# Patient Record
Sex: Female | Born: 1980 | Race: White | Hispanic: No | Marital: Married | Smoking: Current every day smoker
Health system: Southern US, Community
[De-identification: ages and names within clinical notes are randomized; demographics above are authoritative.]

## PROBLEM LIST (undated history)

## (undated) DIAGNOSIS — M069 Rheumatoid arthritis, unspecified: Secondary | ICD-10-CM

## (undated) DIAGNOSIS — K219 Gastro-esophageal reflux disease without esophagitis: Secondary | ICD-10-CM

## (undated) DIAGNOSIS — F329 Major depressive disorder, single episode, unspecified: Secondary | ICD-10-CM

## (undated) DIAGNOSIS — R87610 Atypical squamous cells of undetermined significance on cytologic smear of cervix (ASC-US): Secondary | ICD-10-CM

## (undated) DIAGNOSIS — F419 Anxiety disorder, unspecified: Secondary | ICD-10-CM

## (undated) DIAGNOSIS — F32A Depression, unspecified: Secondary | ICD-10-CM

## (undated) HISTORY — DX: Gastro-esophageal reflux disease without esophagitis: K21.9

## (undated) HISTORY — DX: Atypical squamous cells of undetermined significance on cytologic smear of cervix (ASC-US): R87.610

## (undated) HISTORY — PX: COLPOSCOPY: SHX161

## (undated) HISTORY — PX: SALPINGOOPHORECTOMY: SHX82

## (undated) HISTORY — DX: Anxiety disorder, unspecified: F41.9

---

## 2005-11-09 ENCOUNTER — Observation Stay: Payer: Self-pay | Admitting: Unknown Physician Specialty

## 2005-11-15 ENCOUNTER — Inpatient Hospital Stay: Payer: Self-pay

## 2008-06-11 ENCOUNTER — Emergency Department: Payer: Self-pay | Admitting: Emergency Medicine

## 2008-10-31 ENCOUNTER — Emergency Department: Payer: Self-pay | Admitting: Emergency Medicine

## 2010-06-27 ENCOUNTER — Ambulatory Visit: Payer: Self-pay | Admitting: Family Medicine

## 2012-05-11 HISTORY — PX: FINGER SURGERY: SHX640

## 2012-12-08 ENCOUNTER — Ambulatory Visit: Payer: Self-pay | Admitting: Family Medicine

## 2012-12-11 ENCOUNTER — Emergency Department: Payer: Self-pay | Admitting: Emergency Medicine

## 2012-12-11 LAB — CBC WITH DIFFERENTIAL/PLATELET
Basophil %: 1.2 %
HCT: 36.8 % (ref 35.0–47.0)
HGB: 12.9 g/dL (ref 12.0–16.0)
Lymphocyte #: 2.1 10*3/uL (ref 1.0–3.6)
Lymphocyte %: 36 %
MCHC: 35.1 g/dL (ref 32.0–36.0)
Monocyte #: 0.6 x10 3/mm (ref 0.2–0.9)
Monocyte %: 9.8 %
Neutrophil #: 3 10*3/uL (ref 1.4–6.5)
Neutrophil %: 51.3 %
RDW: 12.5 % (ref 11.5–14.5)
WBC: 5.9 10*3/uL (ref 3.6–11.0)

## 2012-12-11 LAB — URINALYSIS, COMPLETE
Bacteria: NONE SEEN
Bilirubin,UR: NEGATIVE
Glucose,UR: NEGATIVE mg/dL (ref 0–75)
Leukocyte Esterase: NEGATIVE
RBC,UR: 1 /HPF (ref 0–5)
Squamous Epithelial: NONE SEEN
WBC UR: NONE SEEN /HPF (ref 0–5)

## 2012-12-11 LAB — COMPREHENSIVE METABOLIC PANEL
Albumin: 4.1 g/dL (ref 3.4–5.0)
Anion Gap: 4 — ABNORMAL LOW (ref 7–16)
Bilirubin,Total: 0.5 mg/dL (ref 0.2–1.0)
Calcium, Total: 9 mg/dL (ref 8.5–10.1)
Co2: 28 mmol/L (ref 21–32)
EGFR (African American): >60
EGFR (Non-African Amer.): >60
Glucose: 75 mg/dL (ref 65–99)
SGOT(AST): 28 U/L (ref 15–37)
Sodium: 140 mmol/L (ref 136–145)

## 2012-12-11 LAB — PREGNANCY, URINE: Pregnancy Test, Urine: NEGATIVE m[IU]/mL

## 2012-12-11 LAB — CK: CK, Total: 117 U/L (ref 21–215)

## 2012-12-11 LAB — GC/CHLAMYDIA PROBE AMP

## 2013-04-17 ENCOUNTER — Emergency Department: Payer: Self-pay | Admitting: Emergency Medicine

## 2013-04-18 ENCOUNTER — Emergency Department: Payer: Self-pay | Admitting: Emergency Medicine

## 2013-10-22 ENCOUNTER — Emergency Department: Payer: Self-pay | Admitting: Internal Medicine

## 2013-10-25 ENCOUNTER — Ambulatory Visit: Payer: Self-pay | Admitting: Orthopedic Surgery

## 2013-10-25 LAB — CBC
HCT: 40.4 % (ref 35.0–47.0)
HGB: 13.6 g/dL (ref 12.0–16.0)
MCH: 34.5 pg — AB (ref 26.0–34.0)
MCHC: 33.7 g/dL (ref 32.0–36.0)
MCV: 102 fL — AB (ref 80–100)
Platelet: 361 10*3/uL (ref 150–440)
RBC: 3.95 10*6/uL (ref 3.80–5.20)
RDW: 12.7 % (ref 11.5–14.5)
WBC: 5.1 10*3/uL (ref 3.6–11.0)

## 2013-10-25 LAB — URINALYSIS, COMPLETE
Bilirubin,UR: NEGATIVE
Glucose,UR: NEGATIVE mg/dL (ref 0–75)
Ketone: NEGATIVE
Leukocyte Esterase: NEGATIVE
Nitrite: NEGATIVE
PH: 6 (ref 4.5–8.0)
Protein: NEGATIVE
Specific Gravity: 1.017 (ref 1.003–1.030)
WBC UR: 4 /HPF (ref 0–5)

## 2013-10-25 LAB — BASIC METABOLIC PANEL
ANION GAP: 5 — AB (ref 7–16)
BUN: 9 mg/dL (ref 7–18)
CALCIUM: 8.7 mg/dL (ref 8.5–10.1)
CO2: 27 mmol/L (ref 21–32)
CREATININE: 0.72 mg/dL (ref 0.60–1.30)
Chloride: 110 mmol/L — ABNORMAL HIGH (ref 98–107)
EGFR (African American): >60
GLUCOSE: 76 mg/dL (ref 65–99)
Osmolality: 281 (ref 275–301)
POTASSIUM: 4.2 mmol/L (ref 3.5–5.1)
SODIUM: 142 mmol/L (ref 136–145)

## 2013-10-25 LAB — APTT: ACTIVATED PTT: 23.9 s (ref 23.6–35.9)

## 2013-10-25 LAB — PROTIME-INR
INR: 1
Prothrombin Time: 13 secs (ref 11.5–14.7)

## 2014-07-20 ENCOUNTER — Emergency Department: Payer: Self-pay | Admitting: Emergency Medicine
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
## 2015-03-06 ENCOUNTER — Ambulatory Visit
Admission: EM | Admit: 2015-03-06 | Discharge: 2015-03-06 | Disposition: A | Discharge status: Home / Self Care | Payer: BLUE CROSS/BLUE SHIELD | Attending: Family Medicine | Admitting: Family Medicine

## 2015-03-06 ENCOUNTER — Ambulatory Visit: Admission: EM | Admit: 2015-03-06 | Discharge: 2015-03-06 | Discharge status: Absent without leave | Payer: Self-pay

## 2015-03-06 DIAGNOSIS — R1084 Generalized abdominal pain: Secondary | ICD-10-CM

## 2015-03-06 DIAGNOSIS — N760 Acute vaginitis: Secondary | ICD-10-CM | POA: Diagnosis not present

## 2015-03-06 DIAGNOSIS — B9689 Other specified bacterial agents as the cause of diseases classified elsewhere: Secondary | ICD-10-CM

## 2015-03-06 DIAGNOSIS — A499 Bacterial infection, unspecified: Secondary | ICD-10-CM

## 2015-03-06 DIAGNOSIS — R102 Pelvic and perineal pain: Secondary | ICD-10-CM

## 2015-03-06 LAB — URINALYSIS COMPLETE WITH MICROSCOPIC (ARMC ONLY)
BILIRUBIN URINE: NEGATIVE
Glucose, UA: NEGATIVE mg/dL
KETONES UR: NEGATIVE mg/dL
LEUKOCYTES UA: NEGATIVE
Nitrite: NEGATIVE
PH: 6.5 (ref 5.0–8.0)
Protein, ur: NEGATIVE mg/dL
RBC / HPF: NONE SEEN RBC/hpf (ref <3)
SPECIFIC GRAVITY, URINE: 1.015 (ref 1.005–1.030)

## 2015-03-06 LAB — CBC WITH DIFFERENTIAL/PLATELET
Basophils Absolute: 0.1 10*3/uL (ref 0–0.1)
Basophils Relative: 1 %
EOS PCT: 2 %
Eosinophils Absolute: 0.1 10*3/uL (ref 0–0.7)
HEMATOCRIT: 38 % (ref 35.0–47.0)
HEMOGLOBIN: 12.9 g/dL (ref 12.0–16.0)
LYMPHS ABS: 1.8 10*3/uL (ref 1.0–3.6)
LYMPHS PCT: 27 %
MCH: 33.1 pg (ref 26.0–34.0)
MCHC: 34 g/dL (ref 32.0–36.0)
MCV: 97.3 fL (ref 80.0–100.0)
Monocytes Absolute: 0.6 10*3/uL (ref 0.2–0.9)
Monocytes Relative: 10 %
NEUTROS ABS: 4.1 10*3/uL (ref 1.4–6.5)
NEUTROS PCT: 60 %
Platelets: 349 10*3/uL (ref 150–440)
RBC: 3.91 MIL/uL (ref 3.80–5.20)
RDW: 12.5 % (ref 11.5–14.5)
WBC: 6.7 10*3/uL (ref 3.6–11.0)

## 2015-03-06 LAB — COMPREHENSIVE METABOLIC PANEL
ALK PHOS: 54 U/L (ref 38–126)
ALT: 13 U/L — AB (ref 14–54)
AST: 18 U/L (ref 15–41)
Albumin: 4.6 g/dL (ref 3.5–5.0)
Anion gap: 9 (ref 5–15)
BILIRUBIN TOTAL: 0.7 mg/dL (ref 0.3–1.2)
BUN: 9 mg/dL (ref 6–20)
CALCIUM: 9.2 mg/dL (ref 8.9–10.3)
CO2: 27 mmol/L (ref 22–32)
CREATININE: 0.73 mg/dL (ref 0.44–1.00)
Chloride: 102 mmol/L (ref 101–111)
Glucose, Bld: 96 mg/dL (ref 65–99)
Potassium: 3.6 mmol/L (ref 3.5–5.1)
Sodium: 138 mmol/L (ref 135–145)
Total Protein: 7.4 g/dL (ref 6.5–8.1)

## 2015-03-06 LAB — WET PREP, GENITAL
TRICH WET PREP: NONE SEEN
YEAST WET PREP: NONE SEEN

## 2015-03-06 LAB — CHLAMYDIA/NGC RT PCR (ARMC ONLY)
Chlamydia Tr: NOT DETECTED
N gonorrhoeae: NOT DETECTED

## 2015-03-06 LAB — PREGNANCY, URINE: PREG TEST UR: NEGATIVE

## 2015-03-06 MED ORDER — METRONIDAZOLE 0.75 % VA GEL: 1.0000 | Freq: Every day | VAGINAL | Status: DC

## 2015-03-06 NOTE — ED Notes (Signed)
Pt verbalizes understanding to go to [facility] tomorrow at 2 for Ultrasound.

## 2015-03-06 NOTE — ED Provider Notes (Signed)
CSN: [id number]     Arrival date & time 03/06/15  1258 History   First MD Initiated Contact with Patient 03/06/15 1331     Chief Complaint  Patient presents with  . Abdominal Pain   (Consider location/radiation/quality/duration/timing/severity/associated sxs/prior Treatment) HPI Comments: 34 yo female with a h/o chronic intermittent abdominal and pelvic pains for the last 3-4 years, every 3-4 months. States pain usually last 3-4 days, but this time pain has lasted 2 weeks and has been associated with intermittent chills, nausea, bloating, "loose stools",  urinary discomfort, and vaginal discharge. Denies any vomiting, watery diarrhea, melena, hematochezia. States has been on Depo provera for contraception for about 4 years.  Also complains of upset stomach after eating and intermittent sensation of "food getting stuck in the chest" after swallowing. Denies any trouble breathing.   Patient is a 34 y.o. female presenting with abdominal pain. The history is provided by the patient.  Abdominal Pain   History reviewed. No pertinent past medical history. History reviewed. No pertinent past surgical history. No family history on file. Social History  Substance Use Topics  . Smoking status: Never Smoker   . Smokeless tobacco: None  . Alcohol Use: Yes   OB History    No data available     Review of Systems  Gastrointestinal: Positive for abdominal pain.    Allergies  Review of patient's allergies indicates no known allergies.  Home Medications   Prior to Admission medications   Medication Sig Start Date End Date Taking? Authorizing Provider  citalopram (CELEXA) 20 MG tablet Take 20 mg by mouth daily.   Yes Historical Provider, MD  lactobacillus acidophilus (BACID) TABS tablet Take 2 tablets by mouth 3 (three) times daily.   Yes Historical Provider, MD  Multiple Vitamin (MULTIVITAMIN) capsule Take 1 capsule by mouth daily.   Yes Historical Provider, MD  metroNIDAZOLE (METROGEL  VAGINAL) 0.75 % vaginal gel Place 1 Applicatorful vaginally at bedtime. For 5 days 03/06/15   Norval Gable, MD   Meds Ordered and Administered this Visit  Medications - No data to display  BP 120/84 mmHg  Pulse 86  Temp(Src) 97.7 F (36.5 C) (Tympanic)  Ht 5\' 4"  (1.626 m)  Wt 135 lb (61.236 kg)  BMI 23.16 kg/m2  SpO2 100% No data found.   Physical Exam  Constitutional: She appears well-developed and well-nourished. No distress.  Abdominal: Soft. Bowel sounds are normal. She exhibits distension (slightly distended). She exhibits no mass. There is tenderness (mild to moderate diffuse tenderness to palpation; worse over the left lower abdomen; no rebound or guarding). There is no rebound and no guarding. Hernia confirmed negative in the right inguinal area and confirmed negative in the left inguinal area.  Genitourinary: Pelvic exam was performed with patient supine. There is no rash, tenderness or lesion on the right labia. There is no rash, tenderness or lesion on the left labia. Uterus is tender. Cervix exhibits friability. Cervix exhibits no discharge. Right adnexum displays tenderness. Right adnexum displays no mass and no fullness. Left adnexum displays tenderness. Left adnexum displays no mass and no fullness. No erythema, tenderness or bleeding in the vagina. No foreign body around the vagina. Vaginal discharge: mild white vaginal discharge.  Lymphadenopathy:       Right: No inguinal adenopathy present.       Left: No inguinal adenopathy present.  Skin: She is not diaphoretic. No erythema.  Nursing note and vitals reviewed.   ED Course  Procedures (including critical care time)  Labs  Review Labs Reviewed  WET PREP, GENITAL - Abnormal; Notable for the following:    Clue Cells Wet Prep HPF POC MODERATE (*)    WBC, Wet Prep HPF POC FEW (*)    All other components within normal limits  URINALYSIS COMPLETEWITH MICROSCOPIC ([facility] ONLY) - Abnormal; Notable for the following:     Hgb urine dipstick TRACE (*)    Squamous Epithelial / LPF 0-5 (*)    All other components within normal limits  COMPREHENSIVE METABOLIC PANEL - Abnormal; Notable for the following:    ALT 13 (*)    All other components within normal limits  CHLAMYDIA/NGC RT PCR ([facility] ONLY)  URINE CULTURE  CBC WITH DIFFERENTIAL/PLATELET  PREGNANCY, URINE    Imaging Review No results found.   Visual Acuity Review  Right Eye Distance:   Left Eye Distance:   Bilateral Distance:    Right Eye Near:   Left Eye Near:    Bilateral Near:         MDM   1. Generalized abdominal pain   2. Pelvic pain in female   3. Bacterial vaginosis     New Prescriptions   METRONIDAZOLE (METROGEL VAGINAL) 0.75 % VAGINAL GEL    Place 1 Applicatorful vaginally at bedtime. For 5 days   1. Lab results and diagnosis reviewed with patient 2. rx as per orders above; reviewed possible side effects, interactions, risks and benefits  3. Recommend abdominal and pelvic ultrasound for further evaluation; patient will be notified of results and further management 4. Follow-up prn if symptoms worsen or don't improve  Norval Gable, MD 03/06/15 [phone]

## 2015-03-06 NOTE — ED Notes (Signed)
Pt states "I have been taking Depo for about 4 years. I developed stomach pain a couple weeks ago and has not let up. I feel bloated after eating, cramping."

## 2015-03-07 ENCOUNTER — Ambulatory Visit: Payer: BLUE CROSS/BLUE SHIELD

## 2015-03-07 ENCOUNTER — Telehealth: Payer: Self-pay

## 2015-03-07 ENCOUNTER — Ambulatory Visit
Admission: RE | Admit: 2015-03-07 | Discharge: 2015-03-07 | Disposition: A | Discharge status: Home / Self Care | Payer: BLUE CROSS/BLUE SHIELD | Source: Ambulatory Visit | Attending: Family Medicine | Admitting: Family Medicine

## 2015-03-07 ENCOUNTER — Other Ambulatory Visit: Payer: Self-pay | Admitting: Family Medicine

## 2015-03-07 DIAGNOSIS — R140 Abdominal distension (gaseous): Secondary | ICD-10-CM

## 2015-03-07 DIAGNOSIS — R1084 Generalized abdominal pain: Secondary | ICD-10-CM

## 2015-03-07 NOTE — ED Notes (Signed)
Call from [facility] [place] [facility]. Patient's US's were (-). Small amount of free fluid in the Pelvis but otherwise normal. Pt had left as she had children to pick up. She will await a call from Dr. Zenda Alpers regarding the test.

## 2015-03-08 ENCOUNTER — Telehealth: Payer: Self-pay | Admitting: Emergency Medicine

## 2015-03-08 LAB — URINE CULTURE
CULTURE: NO GROWTH
SPECIAL REQUESTS: NORMAL

## 2015-03-08 NOTE — ED Notes (Signed)
Patient was notified that her culture results were negative and that her ultrasound was negative except from some fluid in the pelvis that should resolve on its own.  Patient was instructed to follow-up with her PCP or gynecologist if her symptoms persist or worsen.  Patient verbalized understanding.

## 2015-03-22 DIAGNOSIS — R41840 Attention and concentration deficit: Secondary | ICD-10-CM | POA: Insufficient documentation

## 2015-03-22 DIAGNOSIS — R002 Palpitations: Secondary | ICD-10-CM | POA: Insufficient documentation

## 2015-07-19 LAB — HM PAP SMEAR

## 2015-08-11 ENCOUNTER — Encounter: Payer: Self-pay | Admitting: Emergency Medicine

## 2015-08-11 ENCOUNTER — Ambulatory Visit
Admission: EM | Admit: 2015-08-11 | Discharge: 2015-08-11 | Disposition: A | Discharge status: Home / Self Care | Payer: Managed Care, Other (non HMO) | Attending: Internal Medicine | Admitting: Internal Medicine

## 2015-08-11 ENCOUNTER — Ambulatory Visit (INDEPENDENT_AMBULATORY_CARE_PROVIDER_SITE_OTHER): Payer: Managed Care, Other (non HMO)

## 2015-08-11 DIAGNOSIS — M722 Plantar fascial fibromatosis: Secondary | ICD-10-CM

## 2015-08-11 DIAGNOSIS — S93699A Other sprain of unspecified foot, initial encounter: Secondary | ICD-10-CM

## 2015-08-11 MED ORDER — ETODOLAC 500 MG PO TABS: 500.0000 mg | ORAL_TABLET | Freq: Two times a day (BID) | ORAL | Status: DC

## 2015-08-11 NOTE — Discharge Instructions (Signed)
Try wearing brace when up and around, to see if better pain relief occurs.   Prescription for [facility]od[facility]ac sent [facility] [place]. Prescription for walking boot orthosis given; can fill at [place], [place], or [place]. Followup as planned with podiatry in April.

## 2015-08-11 NOTE — ED Provider Notes (Signed)
CSN: [id number]     Arrival date & time 08/11/15  1320 History   First MD Initiated Contact with Patient 08/11/15 1409     Chief Complaint  Patient presents with  . Foot Pain   HPI  35 yo lady with hx of walking on beach 3 wks ago, in tennis shoes (usually walks on beach barefoot or in flip-flops).  Long walk, 1-1.5 miles.  Subsequent onset of pain/swelling in medial arch of R foot, not improving.  Has been wearing a foot brace qhs, not helping.  Works at [employer] as a Scientist, water quality, on Retail banker all day.  Denies bruising, redness, rash on foot.  No fever, no other joint pain.  No malaise.    History reviewed. No pertinent past medical history. History reviewed. R pinky finger surgery History reviewed. Mother DM, father hypoglycemia Social History  Substance Use Topics  . Smoking status: Previous Smoker   . Smokeless tobacco: None  . Alcohol Use: Yes    Review of Systems  All other systems reviewed and are negative.   Allergies  Review of patient's allergies indicates no known allergies.  Home Medications   Prior to Admission medications   Medication Sig Start Date End Date Taking? Authorizing Provider  amitriptyline (ELAVIL) 10 MG tablet Take 10 mg by mouth at bedtime.   Yes Historical Provider, MD  citalopram (CELEXA) 20 MG tablet Take 20 mg by mouth daily.    Historical Provider, MD  etodolac (LODINE) 500 MG tablet Take 1 tablet (500 mg total) by mouth 2 (two) times daily. 08/11/15   Sherlene Shams, MD  lactobacillus acidophilus (BACID) TABS tablet Take 2 tablets by mouth 3 (three) times daily.    Historical Provider, MD  Multiple Vitamin (MULTIVITAMIN) capsule Take 1 capsule by mouth daily.    Historical Provider, MD      BP 130/85 mmHg  Pulse 80  Temp(Src) 99 F (37.2 C) (Tympanic)  Resp 16  Ht 5\' 4"  (1.626 m)  Wt 129 lb (58.514 kg)  BMI 22.13 kg/m2  SpO2 100%  LMP 07/31/2015 (Approximate)   Physical Exam  Constitutional: She is oriented to person, place, and time. No  distress.  Alert, nicely groomed  HENT:  Head: Atraumatic.  Eyes:  Conjugate gaze, no eye redness/drainage  Neck: Neck supple.  Cardiovascular: Normal rate.   Pulmonary/Chest: No respiratory distress.  Abdominal: She exhibits no distension.  Musculoskeletal: Normal range of motion.  No leg swelling Mildly swollen R medial instep compared to L.  Not bruised/red, no rash.  Foot is warm.  Diffusely tender to palpation over swollen area.  Neurological: She is alert and oriented to person, place, and time.  Skin: Skin is warm and dry.  No cyanosis  Nursing note and vitals reviewed.   ED Course  Procedures (including critical care time)  Imaging Review Dg Foot Complete Right  08/11/2015  CLINICAL DATA:  RIGHT plantar posterior and medial foot pain for 3 weeks after walking a long distance at beach in [employer] [place] EXAM: RIGHT FOOT COMPLETE - 3+ VIEW COMPARISON:  None FINDINGS: Osseous mineralization normal. Joint spaces preserved. No fracture, dislocation, or bone destruction. IMPRESSION: Normal exam. Electronically Signed   By: Lavonia Dana M.D.   On: 08/11/2015 14:36       MDM   1. Traumatic plantar fasciitis    Meds ordered this encounter  Medications  . etodolac (LODINE) 500 MG tablet    Sig: Take 1 tablet (500 mg total) by mouth 2 (two)  times daily.    Dispense:  30 tablet    Refill:  1   rx given for walking boot orthosis, to wear while up and around.  Post op shoe might also give some relief. Followup as planned with podiatrist mid April.      Sherlene Shams, MD 08/12/15 [phone]

## 2015-08-11 NOTE — ED Notes (Signed)
Patient c/o pain on the bottom of her right foot for 3 weeks.

## 2015-09-25 DIAGNOSIS — M1380 Other specified arthritis, unspecified site: Secondary | ICD-10-CM | POA: Insufficient documentation

## 2015-10-04 ENCOUNTER — Other Ambulatory Visit: Payer: Self-pay | Admitting: Student

## 2015-10-04 DIAGNOSIS — K529 Noninfective gastroenteritis and colitis, unspecified: Secondary | ICD-10-CM

## 2015-10-04 DIAGNOSIS — R1084 Generalized abdominal pain: Secondary | ICD-10-CM

## 2015-10-17 ENCOUNTER — Ambulatory Visit
Admission: RE | Admit: 2015-10-17 | Discharge: 2015-10-17 | Disposition: A | Discharge status: Home / Self Care | Payer: Managed Care, Other (non HMO) | Source: Ambulatory Visit | Attending: Student | Admitting: Student

## 2015-10-17 DIAGNOSIS — R1084 Generalized abdominal pain: Secondary | ICD-10-CM | POA: Insufficient documentation

## 2015-10-17 DIAGNOSIS — K529 Noninfective gastroenteritis and colitis, unspecified: Secondary | ICD-10-CM | POA: Diagnosis present

## 2015-10-17 MED ORDER — TECHNETIUM TC 99M MEBROFENIN IV KIT
5.0000 | PACK | Freq: Once | INTRAVENOUS | Status: AC | PRN
  Administered 2015-10-17: 5.09 via INTRAVENOUS

## 2015-12-24 DIAGNOSIS — Z9181 History of falling: Secondary | ICD-10-CM | POA: Insufficient documentation

## 2015-12-24 DIAGNOSIS — M25521 Pain in right elbow: Secondary | ICD-10-CM | POA: Insufficient documentation

## 2015-12-24 DIAGNOSIS — M542 Cervicalgia: Secondary | ICD-10-CM

## 2015-12-24 DIAGNOSIS — Z1589 Genetic susceptibility to other disease: Secondary | ICD-10-CM | POA: Insufficient documentation

## 2015-12-24 DIAGNOSIS — R200 Anesthesia of skin: Secondary | ICD-10-CM | POA: Insufficient documentation

## 2015-12-24 DIAGNOSIS — G43909 Migraine, unspecified, not intractable, without status migrainosus: Secondary | ICD-10-CM | POA: Insufficient documentation

## 2015-12-24 DIAGNOSIS — Z79899 Other long term (current) drug therapy: Secondary | ICD-10-CM | POA: Insufficient documentation

## 2015-12-24 DIAGNOSIS — G8929 Other chronic pain: Secondary | ICD-10-CM | POA: Insufficient documentation

## 2015-12-24 DIAGNOSIS — M5442 Lumbago with sciatica, left side: Secondary | ICD-10-CM

## 2015-12-24 DIAGNOSIS — M5441 Lumbago with sciatica, right side: Secondary | ICD-10-CM

## 2015-12-24 DIAGNOSIS — M25522 Pain in left elbow: Secondary | ICD-10-CM

## 2015-12-24 DIAGNOSIS — M2560 Stiffness of unspecified joint, not elsewhere classified: Secondary | ICD-10-CM | POA: Insufficient documentation

## 2015-12-24 DIAGNOSIS — R202 Paresthesia of skin: Secondary | ICD-10-CM

## 2016-05-25 DIAGNOSIS — Z791 Long term (current) use of non-steroidal anti-inflammatories (NSAID): Secondary | ICD-10-CM | POA: Insufficient documentation

## 2016-05-25 DIAGNOSIS — N938 Other specified abnormal uterine and vaginal bleeding: Secondary | ICD-10-CM | POA: Insufficient documentation

## 2016-06-12 DIAGNOSIS — E782 Mixed hyperlipidemia: Secondary | ICD-10-CM | POA: Insufficient documentation

## 2016-10-08 ENCOUNTER — Ambulatory Visit
Admission: EM | Admit: 2016-10-08 | Discharge: 2016-10-08 | Disposition: A | Discharge status: Home / Self Care | Payer: Managed Care, Other (non HMO) | Attending: Family Medicine | Admitting: Family Medicine

## 2016-10-08 DIAGNOSIS — R05 Cough: Secondary | ICD-10-CM

## 2016-10-08 DIAGNOSIS — B9789 Other viral agents as the cause of diseases classified elsewhere: Secondary | ICD-10-CM | POA: Diagnosis not present

## 2016-10-08 DIAGNOSIS — J069 Acute upper respiratory infection, unspecified: Secondary | ICD-10-CM | POA: Diagnosis not present

## 2016-10-08 HISTORY — DX: Rheumatoid arthritis, unspecified: M06.9

## 2016-10-08 HISTORY — DX: Depression, unspecified: F32.A

## 2016-10-08 HISTORY — DX: Major depressive disorder, single episode, unspecified: F32.9

## 2016-10-08 MED ORDER — HYDROCOD POLST-CPM POLST ER 10-8 MG/5ML PO SUER: 5.0000 mL | Freq: Two times a day (BID) | ORAL | 0 refills | Status: DC | PRN

## 2016-10-08 MED ORDER — FEXOFENADINE-PSEUDOEPHED ER 180-240 MG PO TB24: 1.0000 | ORAL_TABLET | Freq: Every day | ORAL | 0 refills | Status: DC

## 2016-10-08 MED ORDER — PREDNISONE 10 MG (21) PO TBPK: ORAL_TABLET | ORAL | 0 refills | Status: DC

## 2016-10-08 NOTE — ED Provider Notes (Signed)
[facility]    CSN: [id number] Arrival date & time: 10/08/16  1635     History   Chief Complaint Chief Complaint  Patient presents with  . URI    HPI Lisa Peters is a 36 y.o. female.   She is here because of URI-like symptoms. She states that this been going on for over a week but then with further questioning on the symptoms started Saturday. Initially started as a sore throat which is actually cleared up mostly. On Tuesday she started having a cough and chest congestion. She reports to "sleeping that she's coughing through the night and coughing up greenish materials at time. She denies any fever. She does not smoke is no one smokes around her except for friend who should see that much. She has past history depression and rheumatoid arthritis. Father has COPD but she is never been diagnosed with any type of pulmonary problems. No known drug allergies. Today no pertinent surgical history.   The history is provided by the patient. No language interpreter was used.  URI  Presenting symptoms: congestion, cough, fatigue, rhinorrhea and sore throat   Presenting symptoms: no ear pain, no facial pain and no fever   Severity:  Moderate Onset quality:  Sudden Timing:  Constant Progression:  Worsening Chronicity:  New Relieved by:  Nothing Worsened by:  Nothing Associated symptoms: headaches, sneezing and swollen glands     Past Medical History:  Diagnosis Date  . Depression   . Rheumatoid arthritis ([place])     There are no active problems to display for this patient.   History reviewed. No pertinent surgical history.  OB History    No data available       Home Medications    Prior to Admission medications   Medication Sig Start Date End Date Taking? Authorizing Provider  Adalimumab (HUMIRA [place]) Inject into the skin.   Yes [provider]  citalopram (CELEXA) 20 MG tablet Take 20 mg by mouth daily.   Yes [provider]  amitriptyline  (ELAVIL) 10 MG tablet Take 10 mg by mouth at bedtime.    [provider]  chlorpheniramine-HYDROcodone (TUSSIONEX PENNKINETIC ER) 10-8 MG/5ML SUER Take 5 mLs by mouth every 12 (twelve) hours as needed. 10/08/16   Frederich Cha, MD  etodolac (LODINE) 500 MG tablet Take 1 tablet (500 mg total) by mouth 2 (two) times daily. 08/11/15   Sherlene Shams, MD  fexofenadine-pseudoephedrine (ALLEGRA-D ALLERGY & CONGESTION) 180-240 MG 24 hr tablet Take 1 tablet by mouth daily. 10/08/16   Frederich Cha, MD  lactobacillus acidophilus (BACID) TABS tablet Take 2 tablets by mouth 3 (three) times daily.    [provider]  metroNIDAZOLE (METROGEL VAGINAL) 0.75 % vaginal gel Place 1 Applicatorful vaginally at bedtime. For 5 days 03/06/15   Norval Gable, MD  Multiple Vitamin (MULTIVITAMIN) capsule Take 1 capsule by mouth daily.    [provider]  predniSONE (STERAPRED UNI-PAK 21 TAB) 10 MG (21) TBPK tablet Sig 6 tablet day 1, 5 tablets day 2, 4 tablets day 3,,3tablets day 4, 2 tablets day 5, 1 tablet day 6 take all tablets orally 10/08/16   Frederich Cha, MD    Family History History reviewed. No pertinent family history.  Social History Social History  Substance Use Topics  . Smoking status: Never Smoker  . Smokeless tobacco: Never Used  . Alcohol use Yes     Allergies   Patient has no known allergies.   Review of Systems  Review of Systems  Constitutional: Positive for fatigue. Negative for fever.  HENT: Positive for congestion, rhinorrhea, sneezing and sore throat. Negative for ear pain.   Respiratory: Positive for cough.   Neurological: Positive for headaches.  All other systems reviewed and are negative.    Physical Exam Triage Vital Signs ED Triage Vitals  Enc Vitals Group     BP 10/08/16 1654 119/87     Pulse Rate 10/08/16 1654 92     Resp 10/08/16 1654 18     Temp 10/08/16 1654 98 F (36.7 C)     Temp Source 10/08/16 1654 Oral     SpO2 10/08/16 1654 97 %      Weight 10/08/16 1654 129 lb (58.5 kg)     Height 10/08/16 1654 5\' 4"  (1.626 m)     Head Circumference --      Peak Flow --      Pain Score 10/08/16 1655 4     Pain Loc --      Pain Edu? --      Excl. in [place]? --    No data found.   Updated Vital Signs BP 119/87 (BP Location: Left Arm)   Pulse 92   Temp 98 F (36.7 C) (Oral)   Resp 18   Ht 5\' 4"  (1.626 m)   Wt 129 lb (58.5 kg)   SpO2 97%   BMI 22.14 kg/m   Visual Acuity Right Eye Distance:   Left Eye Distance:   Bilateral Distance:    Right Eye Near:   Left Eye Near:    Bilateral Near:     Physical Exam  Constitutional: She is oriented to person, place, and time. She appears well-developed and well-nourished.  HENT:  Head: Normocephalic and atraumatic.  Right Ear: Hearing, tympanic membrane, external ear and ear canal normal.  Left Ear: Hearing, tympanic membrane, external ear and ear canal normal.  Nose: Mucosal edema present. No rhinorrhea. Right sinus exhibits no maxillary sinus tenderness and no frontal sinus tenderness. Left sinus exhibits no maxillary sinus tenderness and no frontal sinus tenderness.  Mouth/Throat: Uvula is midline, oropharynx is clear and moist and mucous membranes are normal. No oral lesions. No uvula swelling. Tonsils are 1+ on the right. Tonsils are 1+ on the left.  Eyes: Conjunctivae, EOM and lids are normal. Pupils are equal, round, and reactive to light. Right conjunctiva is not injected. Right conjunctiva has no hemorrhage. Left conjunctiva is not injected. Left conjunctiva has no hemorrhage.  Neck: Normal range of motion. Neck supple.  Cardiovascular: Normal rate and regular rhythm.   Pulmonary/Chest: Effort normal and breath sounds normal.  Musculoskeletal: Normal range of motion.  Neurological: She is alert and oriented to person, place, and time.  Skin: Skin is warm.  Psychiatric: She has a normal mood and affect.  Vitals reviewed.    UC Treatments / Results  Labs (all labs  ordered are listed, but only abnormal results are displayed) Labs Reviewed - No data to display  EKG  EKG Interpretation None       Radiology No results found.  Procedures Procedures (including critical care time)  Medications Ordered in UC Medications - No data to display   Initial Impression / Assessment and Plan / UC Course  I have reviewed the triage vital signs and the nursing notes.  Pertinent labs & imaging results that were available during my care of the patient were reviewed by me and considered in my medical decision making (see chart for details).  Patient has what appears to be a viral URI she's has reported symptoms more than a week ago but she then tells me everything started Saturday which means this is really only 65 days old. We'll hold off on any antibiotics at this time so longer essentially sore place her on prednisone 6 day course for inflammation and allergies Allegra-D 24 1 tablet daily and will place her on Tussionex 1 teaspoon twice a day for the cough pop PCP in 2-3 weeks or return here to see if not better.   Final Clinical Impressions(s) / UC Diagnoses   Final diagnoses:  Upper respiratory tract infection, unspecified type  Viral URI with cough    New Prescriptions New Prescriptions   CHLORPHENIRAMINE-HYDROCODONE (TUSSIONEX PENNKINETIC ER) 10-8 MG/5ML SUER    Take 5 mLs by mouth every 12 (twelve) hours as needed.   FEXOFENADINE-PSEUDOEPHEDRINE (ALLEGRA-D ALLERGY & CONGESTION) 180-240 MG 24 HR TABLET    Take 1 tablet by mouth daily.   PREDNISONE (STERAPRED UNI-PAK 21 TAB) 10 MG (21) TBPK TABLET    Sig 6 tablet day 1, 5 tablets day 2, 4 tablets day 3,,3tablets day 4, 2 tablets day 5, 1 tablet day 6 take all tablets orally     Note: This dictation was prepared with Dragon dictation along with smaller phrase technology. Any transcriptional errors that result from this process are unintentional.   Frederich Cha, MD 10/08/16 1737

## 2016-10-08 NOTE — ED Triage Notes (Signed)
Pt c/o chest congestion, dry non productive cough for about 2 weeks. Headache and has to work to take a good deep breath.

## 2016-11-16 ENCOUNTER — Ambulatory Visit: Payer: Self-pay | Admitting: Obstetrics and Gynecology

## 2016-12-14 DIAGNOSIS — M5442 Lumbago with sciatica, left side: Secondary | ICD-10-CM | POA: Diagnosis not present

## 2016-12-14 DIAGNOSIS — M1380 Other specified arthritis, unspecified site: Secondary | ICD-10-CM | POA: Diagnosis not present

## 2016-12-14 DIAGNOSIS — Z79899 Other long term (current) drug therapy: Secondary | ICD-10-CM | POA: Diagnosis not present

## 2016-12-14 DIAGNOSIS — Z1589 Genetic susceptibility to other disease: Secondary | ICD-10-CM | POA: Diagnosis not present

## 2017-01-06 DIAGNOSIS — H5203 Hypermetropia, bilateral: Secondary | ICD-10-CM | POA: Diagnosis not present

## 2017-02-01 ENCOUNTER — Encounter: Payer: Self-pay | Admitting: Obstetrics and Gynecology

## 2017-02-01 ENCOUNTER — Ambulatory Visit (INDEPENDENT_AMBULATORY_CARE_PROVIDER_SITE_OTHER): Payer: BLUE CROSS/BLUE SHIELD | Admitting: Obstetrics and Gynecology

## 2017-02-01 VITALS — BP 116/70 | HR 90 | Ht 64.0 in | Wt 133.0 lb

## 2017-02-01 DIAGNOSIS — Z23 Encounter for immunization: Secondary | ICD-10-CM | POA: Diagnosis not present

## 2017-02-01 DIAGNOSIS — Z01419 Encounter for gynecological examination (general) (routine) without abnormal findings: Secondary | ICD-10-CM

## 2017-02-01 MED ORDER — MEDROXYPROGESTERONE ACETATE 150 MG/ML IM SUSP
150.0000 mg | INTRAMUSCULAR | 3 refills | Status: DC
Start: 2017-02-01 — End: 2018-01-08

## 2017-02-01 NOTE — Patient Instructions (Signed)
Preventive Care 18-39 Years, Female Preventive care refers to lifestyle choices and visits with your health care provider that can promote health and wellness. What does preventive care include?  A yearly physical exam. This is also called an annual well check.  Dental exams once or twice a year.  Routine eye exams. Ask your health care provider how often you should have your eyes checked.  Personal lifestyle choices, including: ? Daily care of your teeth and gums. ? Regular physical activity. ? Eating a healthy diet. ? Avoiding tobacco and drug use. ? Limiting alcohol use. ? Practicing safe sex. ? Taking vitamin and mineral supplements as recommended by your health care provider. What happens during an annual well check? The services and screenings done by your health care provider during your annual well check will depend on your age, overall health, lifestyle risk factors, and family history of disease. Counseling Your health care provider may ask you questions about your:  Alcohol use.  Tobacco use.  Drug use.  Emotional well-being.  Home and relationship well-being.  Sexual activity.  Eating habits.  Work and work Statistician.  Method of birth control.  Menstrual cycle.  Pregnancy history.  Screening You may have the following tests or measurements:  Height, weight, and BMI.  Diabetes screening. This is done by checking your blood sugar (glucose) after you have not eaten for a while (fasting).  Blood pressure.  Lipid and cholesterol levels. These may be checked every 5 years starting at age 66.  Skin check.  Hepatitis C blood test.  Hepatitis B blood test.  Sexually transmitted disease (STD) testing.  BRCA-related cancer screening. This may be done if you have a family history of breast, ovarian, tubal, or peritoneal cancers.  Pelvic exam and Pap test. This may be done every 3 years starting at age 40. Starting at age 59, this may be done every 5  years if you have a Pap test in combination with an HPV test.  Discuss your test results, treatment options, and if necessary, the need for more tests with your health care provider. Vaccines Your health care provider may recommend certain vaccines, such as:  Influenza vaccine. This is recommended every year.  Tetanus, diphtheria, and acellular pertussis (Tdap, Td) vaccine. You may need a Td booster every 10 years.  Varicella vaccine. You may need this if you have not been vaccinated.  HPV vaccine. If you are 69 or younger, you may need three doses over 6 months.  Measles, mumps, and rubella (MMR) vaccine. You may need at least one dose of MMR. You may also need a second dose.  Pneumococcal 13-valent conjugate (PCV13) vaccine. You may need this if you have certain conditions and were not previously vaccinated.  Pneumococcal polysaccharide (PPSV23) vaccine. You may need one or two doses if you smoke cigarettes or if you have certain conditions.  Meningococcal vaccine. One dose is recommended if you are age 27-21 years and a first-year college student living in a residence hall, or if you have one of several medical conditions. You may also need additional booster doses.  Hepatitis A vaccine. You may need this if you have certain conditions or if you travel or work in places where you may be exposed to hepatitis A.  Hepatitis B vaccine. You may need this if you have certain conditions or if you travel or work in places where you may be exposed to hepatitis B.  Haemophilus influenzae type b (Hib) vaccine. You may need this if  you have certain risk factors.  Talk to your health care provider about which screenings and vaccines you need and how often you need them. This information is not intended to replace advice given to you by your health care provider. Make sure you discuss any questions you have with your health care provider. Document Released: 06/23/2001 Document Revised: 01/15/2016  Document Reviewed: 02/26/2015 Elsevier Interactive Patient Education  2017 [employer].

## 2017-02-01 NOTE — Progress Notes (Signed)
Gynecology Annual Exam  PCP: Sherrin Daisy, MD  Chief Complaint:  Chief Complaint  Patient presents with  . Gynecologic Exam    Discuss Starting Depo    History of Present Illness: Patient is a 36 y.o. [id number] presents for annual exam. The patient has no complaints today.   LMP: Patient's last menstrual period was 01/18/2017 (exact date). Average Interval: regular, 28 days Duration of flow: 5 days Heavy Menses: moderate Clots: no Intermenstrual Bleeding: no Postcoital Bleeding: no Dysmenorrhea: yes  The patient is not currently sexually active. She currently uses abstinence for contraception. She denies dyspareunia.  The patient does perform self breast exams.  There is no notable family history of breast or ovarian cancer in her family.  The patient wears seatbelts: yes.   The patient has regular exercise: not asked.    The patient denies current symptoms of depression.    Review of Systems: Review of Systems  Constitutional: Negative for chills and fever.  HENT: Negative for congestion.   Respiratory: Negative for cough and shortness of breath.   Cardiovascular: Negative for chest pain and palpitations.  Gastrointestinal: Negative for abdominal pain, constipation, diarrhea, heartburn, nausea and vomiting.  Genitourinary: Negative for dysuria, frequency and urgency.  Skin: Negative for itching and rash.  Neurological: Negative for dizziness and headaches.  Endo/Heme/Allergies: Negative for polydipsia.  Psychiatric/Behavioral: Negative for depression.    Past Medical History:  Past Medical History:  Diagnosis Date  . Depression   . 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 Rheumatoid arthritis [facility])     Past Surgical History:  Past Surgical History:  Procedure Laterality Date  . COLPOSCOPY    . FINGER SURGERY  2014    Gynecologic History:  Patient's last menstrual period was 01/18/2017 (exact date). Contraception: none  (condoms) Last Pap: Results were:07/19/2015 NIL and HR HPV negative  Endometrial biopsy 07/19/2015 Underdeveloped endometrial glans and decidualized stroma negative for hyperplasia or malignancy Ultrasound (transvaginal) 07/25/2015: negative for fibroid, endometrial polyps, EMS 2.86cm, normal ovaries, no free fluid  Obstetric History: [id number]  Family History:  Family History  Problem Relation Age of Onset  . Diabetes Mother   . Arthritis Father   . Diabetes Father        hypoglycemia    Social History:  Social History   Social History  . Marital status: Single    Spouse name: N/A  . Number of children: N/A  . Years of education: N/A   Occupational History  . Not on file.   Social History Main Topics  . Smoking status: Former Research scientist (life sciences)  . Smokeless tobacco: Never Used     Comment: quit 2015  . Alcohol use Yes  . Drug use: No  . Sexual activity: Yes    Birth control/ protection: None   Other Topics Concern  . Not on file   Social History Narrative  . No narrative on file    Allergies:  No Known Allergies  Medications: Prior to Admission medications   Medication Sig Start Date End Date Taking? Authorizing Provider  Adalimumab (HUMIRA [place]) Inject into the skin.    [provider]  amitriptyline (ELAVIL) 10 MG tablet Take 10 mg by mouth at bedtime.    [provider]  amphetamine-dextroamphetamine (ADDERALL) 15 MG tablet Take 15 mg by mouth daily. 03/24/16   [provider]  Ascorbic Acid (VITAMIN C CR) 500 MG CPCR Take 1 capsule by mouth daily.    [provider]  Biotin 10000 MCG TABS Take 1 tablet by mouth daily.    [provider]  chlorpheniramine-HYDROcodone (TUSSIONEX PENNKINETIC ER) 10-8 MG/5ML SUER Take 5 mLs by mouth every 12 (twelve) hours as needed. 10/08/16   Frederich Cha, MD  Cholecalciferol (VITAMIN D3) 400 units CAPS Take 1 capsule by mouth.    [provider]  citalopram (CELEXA) 20 MG tablet Take 20 mg  by mouth daily.    [provider]  etodolac (LODINE) 500 MG tablet Take 1 tablet (500 mg total) by mouth 2 (two) times daily. 08/11/15   Sherlene Shams, MD  fexofenadine-pseudoephedrine (ALLEGRA-D ALLERGY & CONGESTION) 180-240 MG 24 hr tablet Take 1 tablet by mouth daily. 10/08/16   Frederich Cha, MD  lactobacillus acidophilus (BACID) TABS tablet Take 2 tablets by mouth 3 (three) times daily.    [provider]  LORazepam (ATIVAN) 0.5 MG tablet Take 0.5 mg by mouth daily.    [provider]  metroNIDAZOLE (METROGEL VAGINAL) 0.75 % vaginal gel Place 1 Applicatorful vaginally at bedtime. For 5 days 03/06/15   Norval Gable, MD  Multiple Vitamin (MULTIVITAMIN) capsule Take 1 capsule by mouth daily.    [provider]  predniSONE (STERAPRED UNI-PAK 21 TAB) 10 MG (21) TBPK tablet Sig 6 tablet day 1, 5 tablets day 2, 4 tablets day 3,,3tablets day 4, 2 tablets day 5, 1 tablet day 6 take all tablets orally 10/08/16   Frederich Cha, MD  tranexamic acid (LYSTEDA) 650 MG TABS tablet Take 1,300 mg by mouth 3 (three) times daily.    [provider]    Physical Exam Vitals: Blood pressure 116/70, pulse 90, height 5\' 4"  (1.626 m), weight 133 lb (60.3 kg), last menstrual period 01/18/2017. Body mass index is 22.83 kg/m.  General: NAD HEENT: normocephalic, anicteric Thyroid: no enlargement, no palpable nodules Pulmonary: No increased work of breathing, CTAB Cardiovascular: RRR, distal pulses 2+ Breast: Breast symmetrical, no tenderness, no palpable nodules or masses, no skin or nipple retraction present, no nipple discharge.  No axillary or supraclavicular lymphadenopathy. Abdomen: NABS, soft, non-tender, non-distended.  Umbilicus without lesions.  No hepatomegaly, splenomegaly or masses palpable. No evidence of hernia  Genitourinary:  External: Normal external female genitalia.  Normal urethral meatus, normal  Bartholin's and Skene's glands.    Vagina: Normal  vaginal mucosa, no evidence of prolapse.    Cervix: Grossly normal in appearance, no bleeding  Uterus: Non-enlarged, mobile, normal contour.  No CMT  Adnexa: ovaries non-enlarged, no adnexal masses  Rectal: deferred  Lymphatic: no evidence of inguinal lymphadenopathy Extremities: no edema, erythema, or tenderness Neurologic: Grossly intact Psychiatric: mood appropriate, affect full  Female chaperone present for pelvic and breast  portions of the physical exam    Assessment: 36 y.o. G3P3003 routine annual exam  Plan: Problem List Items Addressed This Visit    None    Visit Diagnoses    Encounter for gynecological examination without abnormal finding    -  Primary      1) STI screening was offered and declined  2) ASCCP guidelines and rational discussed.  Patient opts for every 3 years screening interval  3) Contraception - she was previously on depo with good cycle control.  Ultrasound and endometrial biopsies last year were normal.  She has had issues with dysmenorrhea and heavier cycles was happy on depo provera but discontinued for symptoms that were ultimately revealed to be RA.  We discussed options for management including expectant, (previously trialed lysteda), IUD, endometrial  ablation, and hysterectomy.  Patient opts for depo-provera for contraception.    She understands that Depo-Provera is a progesterone only therapy, and that patients often patients have irregular and unpredictable vaginal bleeding or amenorrhea. She understands that other side effects are possible related to systemic progesterone, including but not limited to, headaches, breast tenderness, nausea, and irritability. While effective at preventing pregnancy Depo-Provera does not prevent transmission of sexually transmitted diseases and use of barrier methods for this purpose was discussed.  She is aware of the need to return every 3 months for re-dosing.  We also discussed the FDA black box warning regarding  long term use (greater than 2 years) and bone loss.  4) Routine healthcare maintenance including cholesterol, diabetes screening discussed managed by PCP  5) Follow up 1 year for routine annual exam

## 2017-02-02 ENCOUNTER — Ambulatory Visit (INDEPENDENT_AMBULATORY_CARE_PROVIDER_SITE_OTHER): Payer: BLUE CROSS/BLUE SHIELD

## 2017-02-02 DIAGNOSIS — Z308 Encounter for other contraceptive management: Secondary | ICD-10-CM

## 2017-02-02 MED ORDER — MEDROXYPROGESTERONE ACETATE 150 MG/ML IM SUSP
150.0000 mg | Freq: Once | INTRAMUSCULAR | Status: AC
  Administered 2017-02-02: 150 mg via INTRAMUSCULAR

## 2017-02-02 NOTE — Progress Notes (Signed)
Pt here for 1st inj of depo inj which was given IM right glut.  NDC# [phone].  Pt not currently sexually active,, LMP 01/18/17 no preg test done.

## 2017-02-10 DIAGNOSIS — F411 Generalized anxiety disorder: Secondary | ICD-10-CM | POA: Diagnosis not present

## 2017-02-10 DIAGNOSIS — F418 Other specified anxiety disorders: Secondary | ICD-10-CM | POA: Diagnosis not present

## 2017-02-17 DIAGNOSIS — F411 Generalized anxiety disorder: Secondary | ICD-10-CM | POA: Diagnosis not present

## 2017-02-23 DIAGNOSIS — M450 Ankylosing spondylitis of multiple sites in spine: Secondary | ICD-10-CM | POA: Insufficient documentation

## 2017-03-10 DIAGNOSIS — F411 Generalized anxiety disorder: Secondary | ICD-10-CM | POA: Diagnosis not present

## 2017-03-17 DIAGNOSIS — F411 Generalized anxiety disorder: Secondary | ICD-10-CM | POA: Diagnosis not present

## 2017-03-24 DIAGNOSIS — F411 Generalized anxiety disorder: Secondary | ICD-10-CM | POA: Diagnosis not present

## 2017-04-14 DIAGNOSIS — F411 Generalized anxiety disorder: Secondary | ICD-10-CM | POA: Diagnosis not present

## 2017-04-27 ENCOUNTER — Ambulatory Visit (INDEPENDENT_AMBULATORY_CARE_PROVIDER_SITE_OTHER): Payer: BLUE CROSS/BLUE SHIELD

## 2017-04-27 DIAGNOSIS — Z3042 Encounter for surveillance of injectable contraceptive: Secondary | ICD-10-CM | POA: Diagnosis not present

## 2017-04-27 MED ORDER — MEDROXYPROGESTERONE ACETATE 150 MG/ML IM SUSP
150.0000 mg | Freq: Once | INTRAMUSCULAR | Status: AC
  Administered 2017-04-27: 150 mg via INTRAMUSCULAR

## 2017-05-07 ENCOUNTER — Ambulatory Visit
Admission: EM | Admit: 2017-05-07 | Discharge: 2017-05-07 | Disposition: A | Discharge status: Home / Self Care | Payer: BLUE CROSS/BLUE SHIELD | Attending: Family Medicine | Admitting: Family Medicine

## 2017-05-07 ENCOUNTER — Encounter: Payer: Self-pay | Admitting: *Deleted

## 2017-05-07 ENCOUNTER — Other Ambulatory Visit: Payer: Self-pay

## 2017-05-07 DIAGNOSIS — Z87891 Personal history of nicotine dependence: Secondary | ICD-10-CM | POA: Diagnosis not present

## 2017-05-07 DIAGNOSIS — Z79899 Other long term (current) drug therapy: Secondary | ICD-10-CM | POA: Insufficient documentation

## 2017-05-07 DIAGNOSIS — R079 Chest pain, unspecified: Secondary | ICD-10-CM

## 2017-05-07 DIAGNOSIS — M069 Rheumatoid arthritis, unspecified: Secondary | ICD-10-CM | POA: Insufficient documentation

## 2017-05-07 DIAGNOSIS — F419 Anxiety disorder, unspecified: Secondary | ICD-10-CM

## 2017-05-07 DIAGNOSIS — F329 Major depressive disorder, single episode, unspecified: Secondary | ICD-10-CM | POA: Insufficient documentation

## 2017-05-07 DIAGNOSIS — R0789 Other chest pain: Secondary | ICD-10-CM | POA: Diagnosis not present

## 2017-05-07 NOTE — ED Triage Notes (Signed)
Patient started having chest pain that feels like her heart is being grabbed and radiates to her right shoulder.

## 2017-05-07 NOTE — Discharge Instructions (Signed)
Continue home medications Follow up with PCP for anxiety management

## 2017-05-07 NOTE — ED Provider Notes (Signed)
[facility]    CSN: [id number] Arrival date & time: 05/07/17  1459     History   Chief Complaint Chief Complaint  Patient presents with  . Chest Pain    HPI Lisa Peters is a 36 y.o. female.   36 yo female with a c/o chest pressure earlier today which lasted about 1 hour. States now pain is resolved. Currently denies any chest pain/pressure, shortness of breath. Patient denies any recent cough, fevers, chills, wheezing, injuries. States has a h/o anxiety and has had similar episodes in the past, however they haven't lasted as long as today's episode.    The history is provided by the patient.    Past Medical History:  Diagnosis Date  . Depression   . 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 Rheumatoid arthritis ([place])     There are no active problems to display for this patient.   Past Surgical History:  Procedure Laterality Date  . COLPOSCOPY    . FINGER SURGERY  2014    OB History    Gravida Para Term Preterm AB Living   3 3 3     3    SAB TAB Ectopic Multiple Live Births           3       Home Medications    Prior to Admission medications   Medication Sig Start Date End Date Taking? Authorizing Provider  Adalimumab (HUMIRA [place]) Inject into the skin.   Yes [provider]  buPROPion (WELLBUTRIN XL) 150 MG 24 hr tablet Take 150 mg by mouth. 11/26/16  Yes [provider]  escitalopram (LEXAPRO) 20 MG tablet  01/31/17  Yes [provider]  medroxyPROGESTERone (DEPO-PROVERA) 150 MG/ML injection Inject 1 mL (150 mg total) into the muscle every 3 (three) months. 02/01/17 05/02/17  Malachy Mood, MD    Family History Family History  Problem Relation Age of Onset  . Diabetes Mother   . Arthritis Father   . Diabetes Father        hypoglycemia    Social History Social History   Tobacco Use  . Smoking status: Former Research scientist (life sciences)  . Smokeless tobacco: Never Used  . Tobacco comment: quit 2015   Substance Use Topics  . Alcohol use: Yes  . Drug use: No     Allergies   Patient has no known allergies.   Review of Systems Review of Systems   Physical Exam Triage Vital Signs ED Triage Vitals  Enc Vitals Group     BP 05/07/17 1510 (!) 133/91     Pulse Rate 05/07/17 1510 78     Resp 05/07/17 1510 18     Temp 05/07/17 1510 98.4 F (36.9 C)     Temp Source 05/07/17 1510 Oral     SpO2 05/07/17 1510 98 %     Weight 05/07/17 1512 136 lb (61.7 kg)     Height 05/07/17 1512 5\' 4"  (1.626 m)     Head Circumference --      Peak Flow --      Pain Score 05/07/17 1512 5     Pain Loc --      Pain Edu? --      Excl. in [place]? --    No data found.  Updated Vital Signs BP (!) 133/91 (BP Location: Left Arm)   Pulse 78   Temp 98.4 F (36.9 C) (Oral)   Resp 18   Ht 5\' 4"  (1.626  m)   Wt 136 lb (61.7 kg)   SpO2 98%   BMI 23.34 kg/m   Visual Acuity Right Eye Distance:   Left Eye Distance:   Bilateral Distance:    Right Eye Near:   Left Eye Near:    Bilateral Near:     Physical Exam  Constitutional: She appears well-developed and well-nourished. No distress.  Cardiovascular: Normal rate, regular rhythm and normal heart sounds.  Pulmonary/Chest: Effort normal and breath sounds normal. No stridor. No respiratory distress. She has no wheezes. She has no rales. She exhibits no tenderness.  Abdominal: Soft. Bowel sounds are normal. She exhibits no distension. There is no tenderness.  Musculoskeletal: She exhibits no edema.  Skin: She is not diaphoretic.  Nursing note and vitals reviewed.    UC Treatments / Results  Labs (all labs ordered are listed, but only abnormal results are displayed) Labs Reviewed - No data to display  EKG  EKG Interpretation None       Radiology No results found.  Procedures Procedures (including critical care time)  Medications Ordered in UC Medications - No data to display   Initial Impression / Assessment and Plan / UC Course  I  have reviewed the triage vital signs and the nursing notes.  Pertinent labs & imaging results that were available during my care of the patient were reviewed by me and considered in my medical decision making (see chart for details).      Final Clinical Impressions(s) / UC Diagnoses   Final diagnoses:  Nonspecific chest pain  Anxiety disorder, unspecified type    ED Discharge Orders    None     1.ekg results (normal) and diagnosis reviewed with patient 2. Recommend continue current anti-anxiety medications  3. Follow-up with PCP 4. Follow up prn if symptoms worsen or don't improve   Controlled Substance Prescriptions [place] Controlled Substance Registry consulted? Not Applicable   Norval Gable, MD 05/07/17 Joen Laura

## 2017-05-10 DIAGNOSIS — Z79899 Other long term (current) drug therapy: Secondary | ICD-10-CM | POA: Diagnosis not present

## 2017-05-10 DIAGNOSIS — Z0000 Encounter for general adult medical examination without abnormal findings: Secondary | ICD-10-CM | POA: Diagnosis not present

## 2017-05-10 DIAGNOSIS — F418 Other specified anxiety disorders: Secondary | ICD-10-CM | POA: Diagnosis not present

## 2017-05-11 HISTORY — PX: ABDOMINAL HYSTERECTOMY: SHX81

## 2017-05-12 DIAGNOSIS — K219 Gastro-esophageal reflux disease without esophagitis: Secondary | ICD-10-CM | POA: Insufficient documentation

## 2017-05-12 DIAGNOSIS — Z79899 Other long term (current) drug therapy: Secondary | ICD-10-CM | POA: Diagnosis not present

## 2017-05-12 DIAGNOSIS — F5104 Psychophysiologic insomnia: Secondary | ICD-10-CM | POA: Insufficient documentation

## 2017-05-12 DIAGNOSIS — R1013 Epigastric pain: Secondary | ICD-10-CM | POA: Insufficient documentation

## 2017-05-12 DIAGNOSIS — M450 Ankylosing spondylitis of multiple sites in spine: Secondary | ICD-10-CM | POA: Diagnosis not present

## 2017-05-12 DIAGNOSIS — F411 Generalized anxiety disorder: Secondary | ICD-10-CM | POA: Diagnosis not present

## 2017-05-12 DIAGNOSIS — R5383 Other fatigue: Secondary | ICD-10-CM | POA: Insufficient documentation

## 2017-05-12 DIAGNOSIS — M1380 Other specified arthritis, unspecified site: Secondary | ICD-10-CM | POA: Diagnosis not present

## 2017-05-12 DIAGNOSIS — M5442 Lumbago with sciatica, left side: Secondary | ICD-10-CM | POA: Diagnosis not present

## 2017-05-12 DIAGNOSIS — Z1589 Genetic susceptibility to other disease: Secondary | ICD-10-CM | POA: Diagnosis not present

## 2017-05-12 DIAGNOSIS — G4700 Insomnia, unspecified: Secondary | ICD-10-CM | POA: Insufficient documentation

## 2017-06-04 DIAGNOSIS — F4323 Adjustment disorder with mixed anxiety and depressed mood: Secondary | ICD-10-CM | POA: Diagnosis not present

## 2017-06-08 DIAGNOSIS — K529 Noninfective gastroenteritis and colitis, unspecified: Secondary | ICD-10-CM | POA: Diagnosis not present

## 2017-06-08 DIAGNOSIS — R1013 Epigastric pain: Secondary | ICD-10-CM | POA: Diagnosis not present

## 2017-06-11 DIAGNOSIS — F4323 Adjustment disorder with mixed anxiety and depressed mood: Secondary | ICD-10-CM | POA: Diagnosis not present

## 2017-06-18 DIAGNOSIS — F4323 Adjustment disorder with mixed anxiety and depressed mood: Secondary | ICD-10-CM | POA: Diagnosis not present

## 2017-06-24 DIAGNOSIS — F3181 Bipolar II disorder: Secondary | ICD-10-CM | POA: Diagnosis not present

## 2017-06-24 DIAGNOSIS — F411 Generalized anxiety disorder: Secondary | ICD-10-CM | POA: Diagnosis not present

## 2017-06-24 DIAGNOSIS — Z79899 Other long term (current) drug therapy: Secondary | ICD-10-CM | POA: Diagnosis not present

## 2017-06-24 DIAGNOSIS — Z1389 Encounter for screening for other disorder: Secondary | ICD-10-CM | POA: Diagnosis not present

## 2017-06-24 DIAGNOSIS — F4310 Post-traumatic stress disorder, unspecified: Secondary | ICD-10-CM | POA: Diagnosis not present

## 2017-07-01 DIAGNOSIS — H6990 Unspecified Eustachian tube disorder, unspecified ear: Secondary | ICD-10-CM | POA: Diagnosis not present

## 2017-07-01 DIAGNOSIS — J019 Acute sinusitis, unspecified: Secondary | ICD-10-CM | POA: Diagnosis not present

## 2017-07-07 DIAGNOSIS — F411 Generalized anxiety disorder: Secondary | ICD-10-CM | POA: Diagnosis not present

## 2017-07-07 DIAGNOSIS — F4310 Post-traumatic stress disorder, unspecified: Secondary | ICD-10-CM | POA: Diagnosis not present

## 2017-07-07 DIAGNOSIS — F3181 Bipolar II disorder: Secondary | ICD-10-CM | POA: Diagnosis not present

## 2017-07-09 DIAGNOSIS — F4323 Adjustment disorder with mixed anxiety and depressed mood: Secondary | ICD-10-CM | POA: Diagnosis not present

## 2017-07-20 ENCOUNTER — Ambulatory Visit: Payer: BLUE CROSS/BLUE SHIELD

## 2017-07-21 ENCOUNTER — Ambulatory Visit: Payer: BLUE CROSS/BLUE SHIELD

## 2017-07-22 ENCOUNTER — Ambulatory Visit (INDEPENDENT_AMBULATORY_CARE_PROVIDER_SITE_OTHER): Payer: BLUE CROSS/BLUE SHIELD

## 2017-07-22 DIAGNOSIS — Z3042 Encounter for surveillance of injectable contraceptive: Secondary | ICD-10-CM | POA: Diagnosis not present

## 2017-07-22 MED ORDER — MEDROXYPROGESTERONE ACETATE 150 MG/ML IM SUSP
150.0000 mg | Freq: Once | INTRAMUSCULAR | Status: AC
  Administered 2017-07-22: 150 mg via INTRAMUSCULAR

## 2017-07-30 DIAGNOSIS — F4323 Adjustment disorder with mixed anxiety and depressed mood: Secondary | ICD-10-CM | POA: Diagnosis not present

## 2017-08-03 ENCOUNTER — Ambulatory Visit: Payer: Self-pay | Admitting: Family Medicine

## 2017-08-04 DIAGNOSIS — F411 Generalized anxiety disorder: Secondary | ICD-10-CM | POA: Diagnosis not present

## 2017-08-04 DIAGNOSIS — F3181 Bipolar II disorder: Secondary | ICD-10-CM | POA: Diagnosis not present

## 2017-08-04 DIAGNOSIS — F4310 Post-traumatic stress disorder, unspecified: Secondary | ICD-10-CM | POA: Diagnosis not present

## 2017-08-09 DIAGNOSIS — R1013 Epigastric pain: Secondary | ICD-10-CM | POA: Diagnosis not present

## 2017-08-09 DIAGNOSIS — K529 Noninfective gastroenteritis and colitis, unspecified: Secondary | ICD-10-CM | POA: Diagnosis not present

## 2017-08-16 DIAGNOSIS — F411 Generalized anxiety disorder: Secondary | ICD-10-CM | POA: Diagnosis not present

## 2017-08-16 DIAGNOSIS — F4310 Post-traumatic stress disorder, unspecified: Secondary | ICD-10-CM | POA: Diagnosis not present

## 2017-08-16 DIAGNOSIS — F3181 Bipolar II disorder: Secondary | ICD-10-CM | POA: Diagnosis not present

## 2017-08-17 ENCOUNTER — Ambulatory Visit: Payer: BLUE CROSS/BLUE SHIELD | Admitting: Family Medicine

## 2017-08-17 ENCOUNTER — Encounter: Payer: Self-pay | Admitting: Family Medicine

## 2017-08-17 VITALS — BP 120/70 | HR 76 | Ht 64.0 in | Wt 142.0 lb

## 2017-08-17 DIAGNOSIS — K219 Gastro-esophageal reflux disease without esophagitis: Secondary | ICD-10-CM

## 2017-08-17 DIAGNOSIS — Z7689 Persons encountering health services in other specified circumstances: Secondary | ICD-10-CM

## 2017-08-17 DIAGNOSIS — J309 Allergic rhinitis, unspecified: Secondary | ICD-10-CM | POA: Insufficient documentation

## 2017-08-17 DIAGNOSIS — F419 Anxiety disorder, unspecified: Secondary | ICD-10-CM | POA: Insufficient documentation

## 2017-08-17 DIAGNOSIS — Z3042 Encounter for surveillance of injectable contraceptive: Secondary | ICD-10-CM

## 2017-08-17 MED ORDER — PANTOPRAZOLE SODIUM 40 MG PO TBEC: 40.0000 mg | DELAYED_RELEASE_TABLET | Freq: Every day | ORAL | 3 refills | Status: AC

## 2017-08-17 NOTE — Progress Notes (Signed)
Name: Lisa Peters   MRN: [id number]    DOB: 09/30/1980   Date:08/17/2017       Progress Note  Subjective  Chief Complaint  Chief Complaint  Patient presents with  . Establish Care    get established- PCP left office    Patient presents for establishment of care with new primary care physician.    No problem-specific Assessment & Plan notes found for this encounter.   Past Medical History:  Diagnosis Date  . Anxiety   . Depression    PTSD  . GERD (gastroesophageal reflux disease)   . 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 Rheumatoid arthritis ([place])    Rheumatoid    Past Surgical History:  Procedure Laterality Date  . COLPOSCOPY    . FINGER SURGERY  2014    Family History  Problem Relation Age of Onset  . Diabetes Mother   . Arthritis Father   . Diabetes Father        hypoglycemia  . Stroke Paternal Grandmother   . Heart disease Paternal Grandfather     Social History   Socioeconomic History  . Marital status: Single    Spouse name: Not on file  . Number of children: Not on file  . Years of education: Not on file  . Highest education level: Not on file  Occupational History  . Not on file  Social Needs  . Financial resource strain: Not on file  . Food insecurity:    Worry: Not on file    Inability: Not on file  . Transportation needs:    Medical: Not on file    Non-medical: Not on file  Tobacco Use  . Smoking status: Former Smoker    Types: Cigarettes  . Smokeless tobacco: Never Used  . Tobacco comment: quit 2015  Substance and Sexual Activity  . Alcohol use: Yes    Comment: occassionally  . Drug use: No  . Sexual activity: Yes    Birth control/protection: Injection  Lifestyle  . Physical activity:    Days per week: Not on file    Minutes per session: Not on file  . Stress: Not on file  Relationships  . Social connections:    Talks on phone: Not on file    Gets together: Not on file    Attends religious  service: Not on file    Active member of club or organization: Not on file    Attends meetings of clubs or organizations: Not on file    Relationship status: Not on file  . Intimate partner violence:    Fear of current or ex partner: Not on file    Emotionally abused: Not on file    Physically abused: Not on file    Forced sexual activity: Not on file  Other Topics Concern  . Not on file  Social History Narrative  . Not on file    No Known Allergies  Outpatient Medications Prior to Visit  Medication Sig Dispense Refill  . Adalimumab (HUMIRA [place]) Inject into the skin. [facility]    . amitriptyline (ELAVIL) 50 MG tablet Take 1 tablet by mouth at bedtime. Roslyn porter    . Cholecalciferol (VITAMIN D) 2000 units CAPS Take 2,000 Units by mouth daily.    Marland Kitchen ibuprofen (ADVIL,MOTRIN) 200 MG tablet Take by mouth. otc    . lurasidone (LATUDA) 20 MG TABS tablet Take 1 tablet by mouth at bedtime. Roslyn porter    .  medroxyPROGESTERone (DEPO-PROVERA) 150 MG/ML injection Inject 1 mL (150 mg total) into the muscle every 3 (three) months. 1 mL 3  . pantoprazole (PROTONIX) 40 MG tablet Take 1 tablet by mouth daily.  1  . buPROPion (WELLBUTRIN XL) 150 MG 24 hr tablet Take 150 mg by mouth.    . escitalopram (LEXAPRO) 20 MG tablet   0   No facility-administered medications prior to visit.     Review of Systems  Constitutional: Negative for chills, diaphoresis, fever, malaise/fatigue and weight loss.  HENT: Negative for congestion, ear discharge, ear pain, hearing loss, nosebleeds, sinus pain, sore throat and tinnitus.   Eyes: Negative for blurred vision, double vision, photophobia, pain, discharge and redness.       To be eval for contacts  Respiratory: Negative for cough, hemoptysis, sputum production, shortness of breath, wheezing and stridor.   Cardiovascular: Negative for chest pain, palpitations, orthopnea, claudication, leg swelling and PND.  Gastrointestinal: Positive for heartburn.  Negative for abdominal pain, blood in stool, constipation, diarrhea, melena, nausea and vomiting.  Genitourinary: Negative for dysuria, flank pain, frequency, hematuria and urgency.  Musculoskeletal: Positive for myalgias. Negative for back pain, joint pain and neck pain.  Skin: Negative for itching and rash.  Neurological: Negative for dizziness, tingling, tremors, sensory change, speech change, focal weakness, seizures, loss of consciousness, weakness and headaches.  Endo/Heme/Allergies: Negative for environmental allergies and polydipsia. Does not bruise/bleed easily.  Psychiatric/Behavioral: Positive for depression. Negative for suicidal ideas. The patient is not nervous/anxious and does not have insomnia.        PTSD     Objective  Vitals:   08/17/17 1445  BP: 120/70  Pulse: 76  Weight: 142 lb (64.4 kg)  Height: 5\' 4"  (1.626 m)    Physical Exam  Constitutional: She is well-developed, well-nourished, and in no distress. No distress.  HENT:  Head: Normocephalic and atraumatic.  Right Ear: External ear normal.  Left Ear: External ear normal.  Nose: Nose normal.  Mouth/Throat: Oropharynx is clear and moist.  Eyes: Pupils are equal, round, and reactive to light. Conjunctivae and EOM are normal. Right eye exhibits no discharge. Left eye exhibits no discharge.  Neck: Normal range of motion. Neck supple. No JVD present. No thyromegaly present.  Cardiovascular: Normal rate, regular rhythm, normal heart sounds and intact distal pulses. Exam reveals no gallop and no friction rub.  No murmur heard. Pulmonary/Chest: Effort normal and breath sounds normal. She has no wheezes. She has no rales.  Abdominal: Soft. Bowel sounds are normal. She exhibits no mass. There is no tenderness. There is no guarding.  Musculoskeletal: Normal range of motion. She exhibits no edema.  Lymphadenopathy:    She has no cervical adenopathy.  Neurological: She is alert. She has normal reflexes.  Skin: Skin is  warm and dry. She is not diaphoretic.  Psychiatric: Mood and affect normal.  Nursing note and vitals reviewed.     Assessment & Plan  Problem List Items Addressed This Visit    None    Visit Diagnoses    Establishing care with new doctor, encounter for    -  Primary   Depo-Provera contraceptive status       Gastroesophageal reflux disease, esophagitis presence not specified       Relevant Medications   pantoprazole (PROTONIX) 40 MG tablet      Meds ordered this encounter  Medications  . pantoprazole (PROTONIX) 40 MG tablet    Sig: Take 1 tablet (40 mg total) by mouth daily.  Dispense:  90 tablet    Refill:  3      Dr. Otilio Miu [facility]  08/17/17

## 2017-09-06 DIAGNOSIS — M542 Cervicalgia: Secondary | ICD-10-CM | POA: Diagnosis not present

## 2017-09-06 DIAGNOSIS — M5441 Lumbago with sciatica, right side: Secondary | ICD-10-CM | POA: Diagnosis not present

## 2017-09-06 DIAGNOSIS — M1380 Other specified arthritis, unspecified site: Secondary | ICD-10-CM | POA: Diagnosis not present

## 2017-09-06 DIAGNOSIS — E559 Vitamin D deficiency, unspecified: Secondary | ICD-10-CM | POA: Diagnosis not present

## 2017-09-06 DIAGNOSIS — Z79899 Other long term (current) drug therapy: Secondary | ICD-10-CM | POA: Diagnosis not present

## 2017-09-06 DIAGNOSIS — M5442 Lumbago with sciatica, left side: Secondary | ICD-10-CM | POA: Diagnosis not present

## 2017-09-06 DIAGNOSIS — M0899 Juvenile arthritis, unspecified, multiple sites: Secondary | ICD-10-CM | POA: Diagnosis not present

## 2017-09-06 DIAGNOSIS — G8929 Other chronic pain: Secondary | ICD-10-CM | POA: Diagnosis not present

## 2017-09-06 DIAGNOSIS — Z1589 Genetic susceptibility to other disease: Secondary | ICD-10-CM | POA: Diagnosis not present

## 2017-09-13 DIAGNOSIS — F411 Generalized anxiety disorder: Secondary | ICD-10-CM | POA: Diagnosis not present

## 2017-09-13 DIAGNOSIS — F3181 Bipolar II disorder: Secondary | ICD-10-CM | POA: Diagnosis not present

## 2017-09-13 DIAGNOSIS — F4310 Post-traumatic stress disorder, unspecified: Secondary | ICD-10-CM | POA: Diagnosis not present

## 2017-09-18 DIAGNOSIS — F411 Generalized anxiety disorder: Secondary | ICD-10-CM | POA: Diagnosis not present

## 2017-09-20 DIAGNOSIS — M25551 Pain in right hip: Secondary | ICD-10-CM | POA: Diagnosis not present

## 2017-09-20 DIAGNOSIS — M069 Rheumatoid arthritis, unspecified: Secondary | ICD-10-CM | POA: Diagnosis not present

## 2017-09-20 DIAGNOSIS — M545 Low back pain: Secondary | ICD-10-CM | POA: Diagnosis not present

## 2017-09-20 DIAGNOSIS — M25552 Pain in left hip: Secondary | ICD-10-CM | POA: Diagnosis not present

## 2017-09-24 DIAGNOSIS — F411 Generalized anxiety disorder: Secondary | ICD-10-CM | POA: Diagnosis not present

## 2017-10-14 ENCOUNTER — Ambulatory Visit: Payer: BLUE CROSS/BLUE SHIELD

## 2017-10-19 ENCOUNTER — Ambulatory Visit (INDEPENDENT_AMBULATORY_CARE_PROVIDER_SITE_OTHER): Payer: BLUE CROSS/BLUE SHIELD

## 2017-10-19 DIAGNOSIS — Z3042 Encounter for surveillance of injectable contraceptive: Secondary | ICD-10-CM

## 2017-10-19 MED ORDER — MEDROXYPROGESTERONE ACETATE 150 MG/ML IM SUSP
150.0000 mg | Freq: Once | INTRAMUSCULAR | Status: AC
  Administered 2017-10-19: 150 mg via INTRAMUSCULAR

## 2017-11-01 ENCOUNTER — Encounter: Payer: Self-pay | Admitting: Family Medicine

## 2017-11-01 ENCOUNTER — Ambulatory Visit: Payer: BLUE CROSS/BLUE SHIELD | Admitting: Family Medicine

## 2017-11-01 VITALS — BP 120/70 | HR 60 | Ht 64.0 in | Wt 146.0 lb

## 2017-11-01 DIAGNOSIS — J0100 Acute maxillary sinusitis, unspecified: Secondary | ICD-10-CM

## 2017-11-01 MED ORDER — AZITHROMYCIN 250 MG PO TABS: ORAL_TABLET | ORAL | 0 refills | Status: DC

## 2017-11-01 NOTE — Progress Notes (Signed)
Name: Lisa Peters   MRN: [id number]    DOB: 1980-10-15   Date:11/01/2017       Progress Note  Subjective  Chief Complaint  Chief Complaint  Patient presents with  . Sinusitis    cong, headaches, facial pressure, ears feel full- flew in from [place] yesterday    Sinusitis  This is a new problem. The current episode started 1 to 4 weeks ago. The problem is unchanged. There has been no fever. The fever has been present for 3 to 4 days. Her pain is at a severity of 6/10. The pain is moderate. Pertinent negatives include no chills, congestion, coughing, diaphoresis, ear pain, headaches, hoarse voice, neck pain, shortness of breath, sinus pressure, sneezing, sore throat or swollen glands. Past treatments include nothing. The treatment provided moderate relief.    No problem-specific Assessment & Plan notes found for this encounter.   Past Medical History:  Diagnosis Date  . Anxiety   . Depression    PTSD  . GERD (gastroesophageal reflux disease)   . 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 Rheumatoid arthritis ([place])    Rheumatoid    Past Surgical History:  Procedure Laterality Date  . COLPOSCOPY    . FINGER SURGERY  2014    Family History  Problem Relation Age of Onset  . Diabetes Mother   . Arthritis Father   . Diabetes Father        hypoglycemia  . Stroke Paternal Grandmother   . Heart disease Paternal Grandfather     Social History   Socioeconomic History  . Marital status: Single    Spouse name: Not on file  . Number of children: Not on file  . Years of education: Not on file  . Highest education level: Not on file  Occupational History  . Not on file  Social Needs  . Financial resource strain: Not on file  . Food insecurity:    Worry: Not on file    Inability: Not on file  . Transportation needs:    Medical: Not on file    Non-medical: Not on file  Tobacco Use  . Smoking status: Former Smoker    Types: Cigarettes  .  Smokeless tobacco: Never Used  . Tobacco comment: quit 2015  Substance and Sexual Activity  . Alcohol use: Yes    Comment: occassionally  . Drug use: No  . Sexual activity: Yes    Birth control/protection: Injection  Lifestyle  . Physical activity:    Days per week: Not on file    Minutes per session: Not on file  . Stress: Not on file  Relationships  . Social connections:    Talks on phone: Not on file    Gets together: Not on file    Attends religious service: Not on file    Active member of club or organization: Not on file    Attends meetings of clubs or organizations: Not on file    Relationship status: Not on file  . Intimate partner violence:    Fear of current or ex partner: Not on file    Emotionally abused: Not on file    Physically abused: Not on file    Forced sexual activity: Not on file  Other Topics Concern  . Not on file  Social History Narrative  . Not on file    No Known Allergies  Outpatient Medications Prior to Visit  Medication Sig Dispense Refill  . Adalimumab (HUMIRA  [place]) Inject into the skin. [facility]    . amitriptyline (ELAVIL) 50 MG tablet Take 1 tablet by mouth at bedtime. [facility] porter    . busPIRone (BUSPAR) 10 MG tablet Take 1 tablet by mouth daily. Roslyn Porter  0  . Cholecalciferol (VITAMIN D) 2000 units CAPS Take 2,000 Units by mouth daily.    Marland Kitchen ibuprofen (ADVIL,MOTRIN) 200 MG tablet Take by mouth. otc    . lurasidone (LATUDA) 20 MG TABS tablet Take 1 tablet by mouth at bedtime. Roslyn porter    . pantoprazole (PROTONIX) 40 MG tablet Take 1 tablet (40 mg total) by mouth daily. 90 tablet 3  . medroxyPROGESTERone (DEPO-PROVERA) 150 MG/ML injection Inject 1 mL (150 mg total) into the muscle every 3 (three) months. 1 mL 3   No facility-administered medications prior to visit.     Review of Systems  Constitutional: Negative for chills, diaphoresis, fever, malaise/fatigue and weight loss.  HENT: Negative for congestion, ear  discharge, ear pain, hoarse voice, sinus pressure, sneezing and sore throat.   Eyes: Negative for blurred vision.  Respiratory: Negative for cough, sputum production, shortness of breath and wheezing.   Cardiovascular: Negative for chest pain, palpitations and leg swelling.  Gastrointestinal: Negative for abdominal pain, blood in stool, constipation, diarrhea, heartburn, melena and nausea.  Genitourinary: Negative for dysuria, frequency, hematuria and urgency.  Musculoskeletal: Negative for back pain, joint pain, myalgias and neck pain.  Skin: Negative for rash.  Neurological: Negative for dizziness, tingling, sensory change, focal weakness and headaches.  Endo/Heme/Allergies: Negative for environmental allergies and polydipsia. Does not bruise/bleed easily.  Psychiatric/Behavioral: Negative for depression and suicidal ideas. The patient is not nervous/anxious and does not have insomnia.      Objective  Vitals:   11/01/17 1617  BP: 120/70  Pulse: 60  Weight: 146 lb (66.2 kg)  Height: 5\' 4"  (1.626 m)    Physical Exam  Constitutional: She is oriented to person, place, and time. She appears well-developed and well-nourished.  HENT:  Head: Normocephalic.  Right Ear: Hearing and external ear normal. Tympanic membrane is retracted.  Left Ear: Hearing and external ear normal. Tympanic membrane is retracted.  Nose: Mucosal edema present.  Mouth/Throat: Posterior oropharyngeal erythema present. No oropharyngeal exudate or posterior oropharyngeal edema.  Eyes: Pupils are equal, round, and reactive to light. Conjunctivae and EOM are normal. Lids are everted and swept, no foreign bodies found. Left eye exhibits no hordeolum. No foreign body present in the left eye. Right conjunctiva is not injected. Left conjunctiva is not injected. No scleral icterus.  Neck: Normal range of motion. Neck supple. No JVD present. No tracheal deviation present. No thyromegaly present.  Cardiovascular: Normal rate,  regular rhythm, normal heart sounds and intact distal pulses. Exam reveals no gallop and no friction rub.  No murmur heard. Pulmonary/Chest: Effort normal and breath sounds normal. No stridor. No respiratory distress. She has no decreased breath sounds. She has no wheezes. She has no rales.  Abdominal: Soft. Bowel sounds are normal. She exhibits no mass. There is no hepatosplenomegaly. There is no tenderness. There is no rebound and no guarding.  Musculoskeletal: Normal range of motion. She exhibits no edema or tenderness.  Lymphadenopathy:       Head (right side): No submandibular adenopathy present.       Head (left side): No submandibular adenopathy present.    She has no cervical adenopathy.  Neurological: She is alert and oriented to person, place, and time. She has normal strength. She  displays normal reflexes. No cranial nerve deficit.  Skin: Skin is warm. No rash noted.  Psychiatric: She has a normal mood and affect. Her mood appears not anxious. She does not exhibit a depressed mood.  Nursing note and vitals reviewed.     Assessment & Plan  Problem List Items Addressed This Visit    None    Visit Diagnoses    Acute maxillary sinusitis, recurrence not specified    -  Primary   acute sinusitis from exposure on trip. Will treat with azithromycin as directed and pseudafed.   Relevant Medications   azithromycin (ZITHROMAX) 250 MG tablet      Meds ordered this encounter  Medications  . azithromycin (ZITHROMAX) 250 MG tablet    Sig: 2 today then 1 a day for 4 days    Dispense:  6 tablet    Refill:  0      Dr. Macon [facility]  11/01/17

## 2017-11-08 DIAGNOSIS — F3181 Bipolar II disorder: Secondary | ICD-10-CM | POA: Diagnosis not present

## 2017-11-08 DIAGNOSIS — F411 Generalized anxiety disorder: Secondary | ICD-10-CM | POA: Diagnosis not present

## 2017-11-08 DIAGNOSIS — F4310 Post-traumatic stress disorder, unspecified: Secondary | ICD-10-CM | POA: Diagnosis not present

## 2017-12-07 DIAGNOSIS — F3181 Bipolar II disorder: Secondary | ICD-10-CM | POA: Diagnosis not present

## 2017-12-07 DIAGNOSIS — F4310 Post-traumatic stress disorder, unspecified: Secondary | ICD-10-CM | POA: Diagnosis not present

## 2017-12-07 DIAGNOSIS — F411 Generalized anxiety disorder: Secondary | ICD-10-CM | POA: Diagnosis not present

## 2017-12-14 DIAGNOSIS — F3131 Bipolar disorder, current episode depressed, mild: Secondary | ICD-10-CM | POA: Diagnosis not present

## 2017-12-20 ENCOUNTER — Ambulatory Visit: Payer: BLUE CROSS/BLUE SHIELD | Admitting: Family Medicine

## 2017-12-20 ENCOUNTER — Encounter: Payer: Self-pay | Admitting: Family Medicine

## 2017-12-20 VITALS — BP 120/70 | HR 88 | Ht 64.0 in | Wt 144.0 lb

## 2017-12-20 DIAGNOSIS — Z9119 Patient's noncompliance with other medical treatment and regimen: Secondary | ICD-10-CM | POA: Diagnosis not present

## 2017-12-20 DIAGNOSIS — N938 Other specified abnormal uterine and vaginal bleeding: Secondary | ICD-10-CM | POA: Diagnosis not present

## 2017-12-20 DIAGNOSIS — F32A Depression, unspecified: Secondary | ICD-10-CM

## 2017-12-20 DIAGNOSIS — R87610 Atypical squamous cells of undetermined significance on cytologic smear of cervix (ASC-US): Secondary | ICD-10-CM

## 2017-12-20 DIAGNOSIS — F329 Major depressive disorder, single episode, unspecified: Secondary | ICD-10-CM

## 2017-12-20 DIAGNOSIS — F419 Anxiety disorder, unspecified: Secondary | ICD-10-CM | POA: Diagnosis not present

## 2017-12-20 DIAGNOSIS — Z91199 Patient's noncompliance with other medical treatment and regimen due to unspecified reason: Secondary | ICD-10-CM

## 2017-12-20 NOTE — Progress Notes (Signed)
Name: Lisa Peters   MRN: [id number]    DOB: [birth year]   Date:12/20/2017       Progress Note  Subjective  Chief Complaint  Chief Complaint  Patient presents with  . referral to psych    Dr Nicolasa Ducking for second opinion    Patient would like a second opinion concerning her current depression. Patient stopped vralar( replaced Latuda) replaced with alcohol.  See PHQ score.  Vaginal Bleeding  The patient's primary symptoms include vaginal bleeding. The patient's pertinent negatives include no genital itching, genital lesions, genital odor, genital rash, missed menses, pelvic pain or vaginal discharge. Primary symptoms comment: last depo june. This is a new problem. The current episode started 1 to 4 weeks ago. The problem occurs constantly. The problem has been unchanged. The pain is moderate. Pertinent negatives include no abdominal pain, anorexia, back pain, chills, constipation, diarrhea, dysuria, fever, flank pain, frequency, headaches, hematuria, joint pain, joint swelling, nausea, painful intercourse, rash, sore throat or urgency. The treatment provided moderate relief. She is sexually active. Contraceptive use: depo provera. Her past medical history is significant for metrorrhagia. There is no history of menorrhagia.    No problem-specific Assessment & Plan notes found for this encounter.   Past Medical History:  Diagnosis Date  . Anxiety   . Depression    PTSD  . GERD (gastroesophageal reflux disease)   . 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 Rheumatoid arthritis ([place])    Rheumatoid    Past Surgical History:  Procedure Laterality Date  . COLPOSCOPY    . FINGER SURGERY  2014    Family History  Problem Relation Age of Onset  . Diabetes Mother   . Arthritis Father   . Diabetes Father        hypoglycemia  . Stroke Paternal Grandmother   . Heart disease Paternal Grandfather     Social History   Socioeconomic History  . Marital status:  Single    Spouse name: Not on file  . Number of children: Not on file  . Years of education: Not on file  . Highest education level: Not on file  Occupational History  . Not on file  Social Needs  . Financial resource strain: Not on file  . Food insecurity:    Worry: Not on file    Inability: Not on file  . Transportation needs:    Medical: Not on file    Non-medical: Not on file  Tobacco Use  . Smoking status: Former Smoker    Types: Cigarettes  . Smokeless tobacco: Never Used  . Tobacco comment: quit 2015  Substance and Sexual Activity  . Alcohol use: Yes    Comment: occassionally  . Drug use: No  . Sexual activity: Yes    Birth control/protection: Injection  Lifestyle  . Physical activity:    Days per week: Not on file    Minutes per session: Not on file  . Stress: Not on file  Relationships  . Social connections:    Talks on phone: Not on file    Gets together: Not on file    Attends religious service: Not on file    Active member of club or organization: Not on file    Attends meetings of clubs or organizations: Not on file    Relationship status: Not on file  . Intimate partner violence:    Fear of current or ex partner: Not on file    Emotionally abused: Not  on file    Physically abused: Not on file    Forced sexual activity: Not on file  Other Topics Concern  . Not on file  Social History Narrative  . Not on file    No Known Allergies  Outpatient Medications Prior to Visit  Medication Sig Dispense Refill  . Adalimumab (HUMIRA [place]) Inject into the skin. [facility]    . busPIRone (BUSPAR) 10 MG tablet Take 1 tablet by mouth daily. Roslyn Porter  0  . Cholecalciferol (VITAMIN D) 2000 units CAPS Take 2,000 Units by mouth daily.    Marland Kitchen ibuprofen (ADVIL,MOTRIN) 200 MG tablet Take by mouth. otc    . medroxyPROGESTERone (DEPO-PROVERA) 150 MG/ML injection Inject 1 mL (150 mg total) into the muscle every 3 (three) months. 1 mL 3  . pantoprazole (PROTONIX)  40 MG tablet Take 1 tablet (40 mg total) by mouth daily. 90 tablet 3  . amitriptyline (ELAVIL) 50 MG tablet Take 1 tablet by mouth at bedtime. Roslyn porter    . lurasidone (LATUDA) 20 MG TABS tablet Take 1 tablet by mouth at bedtime. Roslyn porter    . azithromycin (ZITHROMAX) 250 MG tablet 2 today then 1 a day for 4 days 6 tablet 0   No facility-administered medications prior to visit.     Review of Systems  Constitutional: Negative for chills, fever, malaise/fatigue and weight loss.  HENT: Negative for ear discharge, ear pain and sore throat.   Eyes: Negative for blurred vision.  Respiratory: Negative for cough, sputum production, shortness of breath and wheezing.   Cardiovascular: Negative for chest pain, palpitations and leg swelling.  Gastrointestinal: Negative for abdominal pain, anorexia, blood in stool, constipation, diarrhea, heartburn, melena and nausea.  Genitourinary: Positive for vaginal bleeding. Negative for dysuria, flank pain, frequency, hematuria, menorrhagia, missed menses, pelvic pain, urgency and vaginal discharge.  Musculoskeletal: Negative for back pain, joint pain, myalgias and neck pain.  Skin: Negative for rash.  Neurological: Negative for dizziness, tingling, sensory change, focal weakness and headaches.  Endo/Heme/Allergies: Negative for environmental allergies and polydipsia. Does not bruise/bleed easily.  Psychiatric/Behavioral: Negative for depression, memory loss, substance abuse and suicidal ideas. The patient is not nervous/anxious and does not have insomnia.      Objective  Vitals:   12/20/17 1014  BP: 120/70  Pulse: 88  Weight: 144 lb (65.3 kg)  Height: 5\' 4"  (1.626 m)    Physical Exam  Constitutional: No distress.  HENT:  Head: Normocephalic and atraumatic.  Right Ear: External ear normal.  Left Ear: External ear normal.  Nose: Nose normal.  Mouth/Throat: Oropharynx is clear and moist.  Eyes: Pupils are equal, round, and reactive to  light. Conjunctivae and EOM are normal. Right eye exhibits no discharge. Left eye exhibits no discharge.  Neck: Normal range of motion. Neck supple. No JVD present. No thyromegaly present.  Cardiovascular: Normal rate, regular rhythm, normal heart sounds and intact distal pulses. Exam reveals no gallop and no friction rub.  No murmur heard. Pulmonary/Chest: Effort normal and breath sounds normal.  Abdominal: Soft. Bowel sounds are normal. She exhibits no mass. There is no tenderness. There is no guarding.  Musculoskeletal: Normal range of motion. She exhibits no edema.  Lymphadenopathy:    She has no cervical adenopathy.  Neurological: She is alert. She has normal reflexes.  Skin: Skin is warm and dry. She is not diaphoretic.  Nursing note and vitals reviewed.     Assessment & Plan  Problem List Items Addressed This Visit  Other   DUB (dysfunctional uterine bleeding) - Primary   Relevant Orders   Ambulatory referral to Obstetrics / Gynecology   CBC with Differential/Platelet    Other Visit Diagnoses    Anxiety and depression       Referral back CBC / for return to therapy. Gave walk-in hours for Ros Porter in [place] for T, Wed, and Friday 8:20-9:20, also gave number to [facility] office.   Noncompliance       Stopped psych medication on own.   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   Relevant Orders   Ambulatory referral to Obstetrics / Gynecology      No orders of the defined types were placed in this encounter.     Dr. Macon [facility]  12/20/17

## 2017-12-21 LAB — CBC WITH DIFFERENTIAL/PLATELET
BASOS: 1 %
Basophils Absolute: 0 10*3/uL (ref 0.0–0.2)
EOS (ABSOLUTE): 0.2 10*3/uL (ref 0.0–0.4)
EOS: 3 %
HEMATOCRIT: 38.7 % (ref 34.0–46.6)
Hemoglobin: 12.8 g/dL (ref 11.1–15.9)
IMMATURE GRANS (ABS): 0 10*3/uL (ref 0.0–0.1)
IMMATURE GRANULOCYTES: 0 %
LYMPHS: 34 %
Lymphocytes Absolute: 2.1 10*3/uL (ref 0.7–3.1)
MCH: 32.6 pg (ref 26.6–33.0)
MCHC: 33.1 g/dL (ref 31.5–35.7)
MCV: 99 fL — AB (ref 79–97)
MONOS ABS: 0.7 10*3/uL (ref 0.1–0.9)
Monocytes: 12 %
NEUTROS ABS: 3.2 10*3/uL (ref 1.4–7.0)
Neutrophils: 50 %
PLATELETS: 440 10*3/uL (ref 150–450)
RBC: 3.93 x10E6/uL (ref 3.77–5.28)
RDW: 13.4 % (ref 12.3–15.4)
WBC: 6.3 10*3/uL (ref 3.4–10.8)

## 2017-12-22 ENCOUNTER — Telehealth: Payer: Self-pay | Admitting: Obstetrics and Gynecology

## 2017-12-22 NOTE — Telephone Encounter (Signed)
[facility] referring for Hx of dysfunctional uterine / and abnormal pap with cervical dysplasia.. Called and left voicemail for patient to call back to be schedule

## 2017-12-27 NOTE — Telephone Encounter (Signed)
Called and left voicemail for patient to call back to be to be schedule

## 2017-12-28 DIAGNOSIS — F3131 Bipolar disorder, current episode depressed, mild: Secondary | ICD-10-CM | POA: Diagnosis not present

## 2017-12-29 NOTE — Telephone Encounter (Signed)
Called and left voicemail for patient to call back to be to be schedule

## 2017-12-29 NOTE — Telephone Encounter (Signed)
Letter sent to PCP due to unable to schedule referral.

## 2018-01-04 DIAGNOSIS — R1013 Epigastric pain: Secondary | ICD-10-CM | POA: Diagnosis not present

## 2018-01-04 DIAGNOSIS — K529 Noninfective gastroenteritis and colitis, unspecified: Secondary | ICD-10-CM | POA: Diagnosis not present

## 2018-01-04 DIAGNOSIS — R112 Nausea with vomiting, unspecified: Secondary | ICD-10-CM | POA: Diagnosis not present

## 2018-01-04 DIAGNOSIS — F3131 Bipolar disorder, current episode depressed, mild: Secondary | ICD-10-CM | POA: Diagnosis not present

## 2018-01-05 ENCOUNTER — Other Ambulatory Visit: Payer: Self-pay | Admitting: Student

## 2018-01-05 DIAGNOSIS — R1013 Epigastric pain: Secondary | ICD-10-CM

## 2018-01-08 ENCOUNTER — Other Ambulatory Visit: Payer: Self-pay | Admitting: Obstetrics and Gynecology

## 2018-01-17 ENCOUNTER — Encounter: Payer: Self-pay | Admitting: Obstetrics and Gynecology

## 2018-01-17 ENCOUNTER — Other Ambulatory Visit (HOSPITAL_COMMUNITY)
Admission: RE | Admit: 2018-01-17 | Discharge: 2018-01-17 | Disposition: A | Discharge status: Home / Self Care | Payer: BLUE CROSS/BLUE SHIELD | Source: Ambulatory Visit | Attending: Obstetrics and Gynecology | Admitting: Obstetrics and Gynecology

## 2018-01-17 ENCOUNTER — Ambulatory Visit: Payer: BLUE CROSS/BLUE SHIELD | Admitting: Obstetrics and Gynecology

## 2018-01-17 VITALS — BP 112/72 | HR 99 | Ht 64.0 in | Wt 146.0 lb

## 2018-01-17 DIAGNOSIS — Z1329 Encounter for screening for other suspected endocrine disorder: Secondary | ICD-10-CM

## 2018-01-17 DIAGNOSIS — Z124 Encounter for screening for malignant neoplasm of cervix: Secondary | ICD-10-CM | POA: Insufficient documentation

## 2018-01-17 DIAGNOSIS — Z113 Encounter for screening for infections with a predominantly sexual mode of transmission: Secondary | ICD-10-CM | POA: Diagnosis not present

## 2018-01-17 DIAGNOSIS — N939 Abnormal uterine and vaginal bleeding, unspecified: Secondary | ICD-10-CM

## 2018-01-17 NOTE — Progress Notes (Signed)
Gynecology Abnormal Uterine Bleeding Initial Evaluation   Chief Complaint:  Chief Complaint  Patient presents with  . Metrorrhagia    referred by [facility]    History of Present Illness:    Paitient is a 37 y.o. G3P3003 who LMP was No LMP recorded. Patient has had an injection., presents today for a problem visit.  She complains of a long standing history of abnormal uterine bleeding, currently being managed on depo provera.  She has had similar issues with breakthrough bleeding on depo previously with negative evaluation at that time.  Attempted OCP's but discontinued secondary to nausea and smoking history.  On restarting depo provera last year she has had good cycle control.  However, every 6 months or so she will have episodes of bleeding, flow equal to regular menses, and lasting up to two weeks.  Menses are associated with moderate menstrual cramping.  The patient is sexually active. She currently uses Depo-Provera injectionsfor contraception.  Last Pap results esults were obtained 05/28/2015 no abnormalities   Previous evaluation: ultrasound from [facility] and [facility] reviewed Prior Diagnosis: AUB. Previous Treatment: depo provera.  LMP: No LMP recorded. Patient has had an injection.   Paramter Normal / Abnormal Prsent  Frequency [place]     Infrequent (>38 days) X   Normal (?24 days ?38 days)     Freequent (<24 days)    Duration Normal (?8 days)     Prolonged (>8 days) X  Regularity Regular (shortest to longest cycle variation ?7-9 days)*     Irregular (shortest to longest cycle variation ?8-10days)* X  Flow Volume Light    (Self reported) Normal X   Heavy        Intermenstrual Bleeding None     Random     Cyclical early     Cyclical mid     Cyclical late        Unscheduled Bleeding  Not applicable    (exogenous hormones) Absent     Present X   FIGO AUB I System: *The available evidence suggests that, using these criteria, the normal range (shortest to  longest) varies with age: 38-25 y of age, ?52 d; 70-41 y, ?7 d; and for 14-45 y, ?9 d    Review of Systems: Review of Systems  Constitutional: Negative for chills and fever.  HENT: Negative for congestion.   Respiratory: Negative for cough and shortness of breath.   Cardiovascular: Negative for chest pain and palpitations.  Gastrointestinal: Negative for abdominal pain, constipation, diarrhea, heartburn, nausea and vomiting.  Genitourinary: Negative for dysuria, frequency and urgency.  Skin: Negative for itching and rash.  Neurological: Negative for dizziness and headaches.  Endo/Heme/Allergies: Negative for polydipsia.  Psychiatric/Behavioral: Negative for depression.    Past Medical History:  Past Medical History:  Diagnosis Date  . Anxiety   . Depression    PTSD  . GERD (gastroesophageal reflux disease)   . 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 Rheumatoid arthritis ([place])    Rheumatoid    Past Surgical History:  Past Surgical History:  Procedure Laterality Date  . COLPOSCOPY    . FINGER SURGERY  2014    Obstetric History: [id number]  Past Gynecologic History:  Pap 07/19/15 NIL HPV negative Contraception: Depo Provera  Prior GYN Imaging 06/27/2010 Normal TVUS with EMS of 8.92mm 1.39 complext right ovarian cyst 11/10/2010 normal TVUS with EMS of 4.22mm 03/07/2015 normal TVUS with EMS of 3.72mm  Prior Endometrial Pathology 07/19/2015  underdeveloped endometrial glands and decidualized stroma, negative for hyperplasia and malignancy   Family History:  Family History  Problem Relation Age of Onset  . Diabetes Mother   . Arthritis Father   . Diabetes Father        hypoglycemia  . Stroke Paternal Grandmother   . Heart disease Paternal Grandfather     Social History:  Social History   Socioeconomic History  . Marital status: Single    Spouse name: Not on file  . Number of children: Not on file  . Years of education: Not on file  .  Highest education level: Not on file  Occupational History  . Not on file  Social Needs  . Financial resource strain: Not on file  . Food insecurity:    Worry: Not on file    Inability: Not on file  . Transportation needs:    Medical: Not on file    Non-medical: Not on file  Tobacco Use  . Smoking status: Former Smoker    Types: Cigarettes  . Smokeless tobacco: Never Used  . Tobacco comment: quit 2015  Substance and Sexual Activity  . Alcohol use: Yes    Comment: occassionally  . Drug use: No  . Sexual activity: Yes    Birth control/protection: Injection  Lifestyle  . Physical activity:    Days per week: Not on file    Minutes per session: Not on file  . Stress: Not on file  Relationships  . Social connections:    Talks on phone: Not on file    Gets together: Not on file    Attends religious service: Not on file    Active member of club or organization: Not on file    Attends meetings of clubs or organizations: Not on file    Relationship status: Not on file  . Intimate partner violence:    Fear of current or ex partner: Not on file    Emotionally abused: Not on file    Physically abused: Not on file    Forced sexual activity: Not on file  Other Topics Concern  . Not on file  Social History Narrative  . Not on file    Allergies:  No Known Allergies  Medications: Prior to Admission medications   Medication Sig Start Date End Date Taking? Authorizing Provider  Adalimumab (HUMIRA [place]) Inject into the skin. [facility]- rheum   Yes [provider]  Cholecalciferol (VITAMIN D) 2000 units CAPS Take 2,000 Units by mouth daily.   Yes [provider]  ibuprofen (ADVIL,MOTRIN) 200 MG tablet Take by mouth. otc   Yes [provider]  medroxyPROGESTERone (DEPO-PROVERA) 150 MG/ML injection INJECT 1 ML (150 MG TOTAL) INTO THE MUSCLE EVERY 3 (THREE) MONTHS. 01/08/18 04/08/18 Yes Malachy Mood, MD  pantoprazole (PROTONIX) 40 MG tablet Take 1 tablet  (40 mg total) by mouth daily. 08/17/17  Yes Juline Patch, MD  amitriptyline (ELAVIL) 50 MG tablet Take 1 tablet by mouth at bedtime. Roslyn porter    [provider]  busPIRone (BUSPAR) 10 MG tablet Take 1 tablet by mouth daily. Frances Furbish 09/13/17   [provider]  lurasidone (LATUDA) 20 MG TABS tablet Take 1 tablet by mouth at bedtime. Roslyn porter 06/25/17   [provider]    Physical Exam Blood pressure 112/72, pulse 99, height 5\' 4"  (1.626 m), weight 146 lb (66.2 kg).  No LMP recorded. Patient has had an injection.  General: NAD HEENT: normocephalic, anicteric Pulmonary: No  increased work of breathing Cardiovascular: RRR, distal pulses 2+ Abdomen: NABS, soft, non-tender, non-distended.  Umbilicus without lesions.  No hepatomegaly, splenomegaly or masses palpable. No evidence of hernia  Genitourinary:  External: Normal external female genitalia.  Normal urethral meatus, normal Bartholin's and Skene's glands.    Vagina: Normal vaginal mucosa, no evidence of prolapse.    Cervix: Grossly normal in appearance, no bleeding  Uterus: Non-enlarged, mobile, normal contour.  No CMT  Adnexa: ovaries non-enlarged, no adnexal masses  Rectal: deferred  Lymphatic: no evidence of inguinal lymphadenopathy Extremities: no edema, erythema, or tenderness Neurologic: Grossly intact Psychiatric: mood appropriate, affect full  Female chaperone present for pelvic portions of the physical exam  Assessment: 37 y.o. [phone] with abnormal uterine bleeding  Plan: Problem List Items Addressed This Visit    None    Visit Diagnoses    Abnormal uterine bleeding    -  Primary   Relevant Orders   TSH   Prolactin   Follicle stimulating hormone   Estradiol   Screening for malignant neoplasm of cervix       Relevant Orders   Cytology - PAP   Thyroid disorder screening       Relevant Orders   TSH   Routine screening for STI (sexually transmitted infection)       Relevant  Orders   Cytology - PAP      1) Discussed management options for abnormal uterine bleeding including expectant, NSAIDs, tranexamic acid (Lysteda), oral progesterone (Provera, norethindrone, megace), Depo Provera, Levonorgestrel containing IUD, endometrial ablation (Novasure) or hysterectomy as definitive surgical management.  Discussed risks and benefits of each method.   Final management decision will hinge on results of patient's work up and whether an underlying etiology for the patients bleeding symptoms can be discerned.  We will conduct a basic work up examining using the PALM-COIEN classification system.  In the meantime will continue depo provera while we await results of her ultrasound and labs.  The role of unopposed estrogen in the development of endometrial hyperplasia or carcinoma is discussed.  The risk of endometrial hyperplasia is linearly correlated with increasing BMI given the production of estrone by adipose tissue. Printed patient education handouts were given to the patient to review at home.  Bleeding precautions reviewed.  - pap  - labs - TVUS - she is interested in possible hysterectomy but I counseled her that my recommendation would be to try Mirena IUD first as despite the perceived risk of IUD insertion, the risk of hysterectomy are much greater.  We also talked about endometrial ablation with my concern there being that the long term efficacy is someone her age is questionable.    2) Return in about 1 week (around 01/24/2018) for TVUS and follow up.   Malachy Mood, MD, [facility], [facility] 01/17/2018, 1:50 PM

## 2018-01-18 DIAGNOSIS — F3131 Bipolar disorder, current episode depressed, mild: Secondary | ICD-10-CM | POA: Diagnosis not present

## 2018-01-18 LAB — FOLLICLE STIMULATING HORMONE: FSH: 2.6 m[IU]/mL

## 2018-01-18 LAB — ESTRADIOL: Estradiol: 133.3 pg/mL

## 2018-01-18 LAB — PROLACTIN: Prolactin: 11 ng/mL (ref 4.8–23.3)

## 2018-01-18 LAB — TSH: TSH: 1.08 u[IU]/mL (ref 0.450–4.500)

## 2018-01-19 DIAGNOSIS — F411 Generalized anxiety disorder: Secondary | ICD-10-CM | POA: Diagnosis not present

## 2018-01-19 DIAGNOSIS — F331 Major depressive disorder, recurrent, moderate: Secondary | ICD-10-CM | POA: Diagnosis not present

## 2018-01-19 DIAGNOSIS — F4310 Post-traumatic stress disorder, unspecified: Secondary | ICD-10-CM | POA: Diagnosis not present

## 2018-01-20 DIAGNOSIS — Z87891 Personal history of nicotine dependence: Secondary | ICD-10-CM | POA: Diagnosis not present

## 2018-01-20 DIAGNOSIS — G479 Sleep disorder, unspecified: Secondary | ICD-10-CM | POA: Diagnosis not present

## 2018-01-20 DIAGNOSIS — F339 Major depressive disorder, recurrent, unspecified: Secondary | ICD-10-CM | POA: Diagnosis not present

## 2018-01-20 LAB — CYTOLOGY - PAP
Chlamydia: NEGATIVE
DIAGNOSIS: NEGATIVE
HPV: NOT DETECTED
Neisseria Gonorrhea: NEGATIVE

## 2018-01-25 ENCOUNTER — Encounter
Admission: RE | Admit: 2018-01-25 | Discharge: 2018-01-25 | Disposition: A | Discharge status: Home / Self Care | Payer: BLUE CROSS/BLUE SHIELD | Source: Ambulatory Visit | Attending: Student | Admitting: Student

## 2018-01-25 ENCOUNTER — Ambulatory Visit
Admission: RE | Admit: 2018-01-25 | Discharge: 2018-01-25 | Disposition: A | Discharge status: Home / Self Care | Payer: BLUE CROSS/BLUE SHIELD | Source: Ambulatory Visit | Attending: Student | Admitting: Student

## 2018-01-25 DIAGNOSIS — R112 Nausea with vomiting, unspecified: Secondary | ICD-10-CM | POA: Insufficient documentation

## 2018-01-25 DIAGNOSIS — F4310 Post-traumatic stress disorder, unspecified: Secondary | ICD-10-CM | POA: Diagnosis not present

## 2018-01-25 DIAGNOSIS — F4312 Post-traumatic stress disorder, chronic: Secondary | ICD-10-CM | POA: Diagnosis not present

## 2018-01-25 DIAGNOSIS — F3181 Bipolar II disorder: Secondary | ICD-10-CM | POA: Diagnosis not present

## 2018-01-25 DIAGNOSIS — R109 Unspecified abdominal pain: Secondary | ICD-10-CM | POA: Diagnosis not present

## 2018-01-25 DIAGNOSIS — R1013 Epigastric pain: Secondary | ICD-10-CM | POA: Diagnosis not present

## 2018-01-25 DIAGNOSIS — F331 Major depressive disorder, recurrent, moderate: Secondary | ICD-10-CM | POA: Diagnosis not present

## 2018-01-25 DIAGNOSIS — F332 Major depressive disorder, recurrent severe without psychotic features: Secondary | ICD-10-CM | POA: Diagnosis not present

## 2018-01-25 DIAGNOSIS — F411 Generalized anxiety disorder: Secondary | ICD-10-CM | POA: Diagnosis not present

## 2018-01-25 MED ORDER — TECHNETIUM TC 99M MEBROFENIN IV KIT
5.3700 | PACK | Freq: Once | INTRAVENOUS | Status: AC | PRN
  Administered 2018-01-25: 5.37 via INTRAVENOUS

## 2018-01-27 ENCOUNTER — Other Ambulatory Visit: Payer: Self-pay | Admitting: Obstetrics and Gynecology

## 2018-01-27 DIAGNOSIS — N939 Abnormal uterine and vaginal bleeding, unspecified: Secondary | ICD-10-CM

## 2018-01-31 ENCOUNTER — Ambulatory Visit (INDEPENDENT_AMBULATORY_CARE_PROVIDER_SITE_OTHER): Payer: BLUE CROSS/BLUE SHIELD

## 2018-01-31 ENCOUNTER — Ambulatory Visit (INDEPENDENT_AMBULATORY_CARE_PROVIDER_SITE_OTHER): Payer: BLUE CROSS/BLUE SHIELD | Admitting: Obstetrics and Gynecology

## 2018-01-31 ENCOUNTER — Encounter: Payer: Self-pay | Admitting: Obstetrics and Gynecology

## 2018-01-31 VITALS — BP 132/94 | HR 89 | Wt 145.0 lb

## 2018-01-31 DIAGNOSIS — N7011 Chronic salpingitis: Secondary | ICD-10-CM | POA: Diagnosis not present

## 2018-01-31 DIAGNOSIS — R030 Elevated blood-pressure reading, without diagnosis of hypertension: Secondary | ICD-10-CM | POA: Diagnosis not present

## 2018-01-31 DIAGNOSIS — N939 Abnormal uterine and vaginal bleeding, unspecified: Secondary | ICD-10-CM

## 2018-01-31 DIAGNOSIS — N800 Endometriosis of uterus: Secondary | ICD-10-CM | POA: Diagnosis not present

## 2018-01-31 DIAGNOSIS — N8003 Adenomyosis of the uterus: Secondary | ICD-10-CM

## 2018-01-31 DIAGNOSIS — G8929 Other chronic pain: Secondary | ICD-10-CM

## 2018-01-31 DIAGNOSIS — N83292 Other ovarian cyst, left side: Secondary | ICD-10-CM

## 2018-01-31 DIAGNOSIS — R102 Pelvic and perineal pain: Secondary | ICD-10-CM

## 2018-01-31 DIAGNOSIS — N809 Endometriosis, unspecified: Secondary | ICD-10-CM

## 2018-01-31 NOTE — Progress Notes (Signed)
Gynecology Ultrasound Follow Up  Chief Complaint:  Chief Complaint  Patient presents with  . Follow-up    GYN U/S     History of Present Illness: Patient is a 37 y.o. female who presents today for ultrasound evaluation of abnormal uterine bleeding.Marland Kitchen  Ultrasound demonstrates the following findgins Adnexa: left adnexa with evidence of hydrosalpinx Uterus: Non-enlarged with endometrial stripe appropriately thinned in the setting of depo provera use but myometrial endometrial interface with signs of adenomyosis Additional: no free fluid  Review of Systems: Review of Systems  Constitutional: Negative.   Gastrointestinal: Positive for abdominal pain. Negative for constipation and diarrhea.  Genitourinary: Negative.   Skin: Negative.     Past Medical History:  Past Medical History:  Diagnosis Date  . Anxiety   . Depression    PTSD  . GERD (gastroesophageal reflux disease)   . 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 Rheumatoid arthritis ([place])    Rheumatoid    Past Surgical History:  Past Surgical History:  Procedure Laterality Date  . COLPOSCOPY    . FINGER SURGERY  2014    Gynecologic History:  No LMP recorded. Patient has had an injection. Contraception: Depo-Provera injections Last Pap: 01/17/18. NIL HPV negative  Family History:  Family History  Problem Relation Age of Onset  . Diabetes Mother   . Arthritis Father   . Diabetes Father        hypoglycemia  . Stroke Paternal Grandmother   . Heart disease Paternal Grandfather     Social History:  Social History   Socioeconomic History  . Marital status: Single    Spouse name: Not on file  . Number of children: Not on file  . Years of education: Not on file  . Highest education level: Not on file  Occupational History  . Not on file  Social Needs  . Financial resource strain: Not on file  . Food insecurity:    Worry: Not on file    Inability: Not on file  . Transportation  needs:    Medical: Not on file    Non-medical: Not on file  Tobacco Use  . Smoking status: Former Smoker    Types: Cigarettes  . Smokeless tobacco: Never Used  . Tobacco comment: quit 2015  Substance and Sexual Activity  . Alcohol use: Yes    Comment: occassionally  . Drug use: No  . Sexual activity: Yes    Birth control/protection: Injection  Lifestyle  . Physical activity:    Days per week: Not on file    Minutes per session: Not on file  . Stress: Not on file  Relationships  . Social connections:    Talks on phone: Not on file    Gets together: Not on file    Attends religious service: Not on file    Active member of club or organization: Not on file    Attends meetings of clubs or organizations: Not on file    Relationship status: Not on file  . Intimate partner violence:    Fear of current or ex partner: Not on file    Emotionally abused: Not on file    Physically abused: Not on file    Forced sexual activity: Not on file  Other Topics Concern  . Not on file  Social History Narrative  . Not on file    Allergies:  No Known Allergies  Medications: Prior to Admission medications   Medication Sig Start Date  End Date Taking? Authorizing Provider  Adalimumab (HUMIRA [place]) Inject into the skin. [facility]- [facility]    [provider]  amitriptyline (ELAVIL) 50 MG tablet Take 1 tablet by mouth at bedtime. Roslyn porter    [provider]  busPIRone (BUSPAR) 10 MG tablet Take 1 tablet by mouth daily. Frances Furbish 09/13/17   [provider]  Cholecalciferol (VITAMIN D) 2000 units CAPS Take 2,000 Units by mouth daily.    [provider]  ibuprofen (ADVIL,MOTRIN) 200 MG tablet Take by mouth. otc    [provider]  lurasidone (LATUDA) 20 MG TABS tablet Take 1 tablet by mouth at bedtime. Roslyn porter 06/25/17   [provider]  medroxyPROGESTERone (DEPO-PROVERA) 150 MG/ML injection INJECT 1 ML (150 MG TOTAL) INTO THE MUSCLE  EVERY 3 (THREE) MONTHS. 01/08/18 04/08/18  Malachy Mood, MD  pantoprazole (PROTONIX) 40 MG tablet Take 1 tablet (40 mg total) by mouth daily. 08/17/17   Juline Patch, MD    Physical Exam Vitals: Blood pressure (!) 132/94, pulse 89, weight 145 lb (65.8 kg).  General: NAD HEENT: normocephalic, anicteric Pulmonary: No increased work of breathing Extremities: no edema, erythema, or tenderness Neurologic: Grossly intact, normal gait Psychiatric: mood appropriate, affect full  US Abdomen Complete  Result Date: 01/25/2018 CLINICAL DATA:  Epigastric abdominal pain EXAM: ABDOMEN ULTRASOUND COMPLETE COMPARISON:  03/07/2015 FINDINGS: Gallbladder: No gallstones or wall thickening visualized. No sonographic Murphy sign noted by sonographer. Common bile duct: Diameter: Normal caliber, 4 mm Liver: No focal lesion identified. Within normal limits in parenchymal echogenicity. Portal vein is patent on color Doppler imaging with normal direction of blood flow towards the liver. IVC: No abnormality visualized. Pancreas: Visualized portion unremarkable. Spleen: Size and appearance within normal limits. Right Kidney: Length: 9.8 cm. Echogenicity within normal limits. No mass or hydronephrosis visualized. Left Kidney: Length: 10.4 cm. Echogenicity within normal limits. No mass or hydronephrosis visualized. Abdominal aorta: No aneurysm visualized. Other findings: None. IMPRESSION: Normal abdominal ultrasound. Electronically Signed   By: Rolm Baptise M.D.   On: 01/25/2018 09:31   US Pelvis Transvanginal Non-ob (tv Only)  Result Date: 01/31/2018 Patient Name: Lisa Peters DOB: 1980-09-11 MRN: [id number] ULTRASOUND REPORT Location: [facility] Date of Service: 01/31/2018 Indications:AUB Findings: The uterus is retroverted and measures 8.76x4.53x3.89cm. Echo texture is heterogenous with evidence of focal masses. Evidences of adenomyosis in the anterior and posterior uterine wall is seen The Endometrium measures  5.28 mm. Right Ovary measures 3.34x2.98x1.87 cm. It is normal in appearance. Left Ovary measures 3.4x3.38x2.98 cm. It is not normal in appearance. A simple cyst with indentations is seen in the Left Adnexa = 27x24.48mm (Possibility of LO cyst vs Lt Hydrosalpinx) Survey of the adnexa demonstrates adnexal mass. There is no free fluid in the cul de sac. Impression 1.Evidences of adenomyosis in the anterior and posterior uterine wall is seen . 2. A simple cyst with indentations is seen in the Left Adnexa = 27x24.68mm (Possibility of LO cyst vs Lt Hydrosalpinx) Recommendations: 1.Clinical correlation with the patient's History and Physical Exam. Ninfa Linden, RDMS Images reviewed.  There is loss of endometrial myometrial interface as well as subendometrial cysts suggestive of adenomyosis.  The left adnexa appears to contains a hydrosalpinx based on internal excrescences consistent with the fimbriated end of the fallopian tubes in cross section. Malachy Mood, MD, [facility], [facility]   Nm Hepato W/eject Fract  Result Date: 01/25/2018 CLINICAL DATA:  Abdominal pain with nausea and vomiting EXAM: NUCLEAR  MEDICINE HEPATOBILIARY IMAGING WITH GALLBLADDER EF VIEWS: Anterior right upper quadrant RADIOPHARMACEUTICALS:  5.37 mCi Tc-81m  Choletec IV COMPARISON:  None. FINDINGS: Liver uptake of radiotracer is unremarkable. There is prompt visualization of gallbladder and small bowel, indicating patency of the cystic and common bile ducts. The patient consumed 8 ounces of Ensure orally with calculation of the computer generated ejection fraction of radiotracer from the gallbladder. The patient experienced abdominal cramping with the oral Ensure consumption. The computer generated ejection fraction of radiotracer from the gallbladder is normal at 45%, normal greater than 33% using the oral agent. IMPRESSION: Normal ejection fraction of radiotracer from the gallbladder. The patient did experience  abdominal cramping with the oral Ensure consumption. Cystic and common bile ducts are patent as is evidenced by visualization of gallbladder and small bowel. Electronically Signed   By: Lowella Grip III M.D.   On: 01/25/2018 13:08    Assessment: 37 y.o. G3P3003 with AUB-A  Plan: Problem List Items Addressed This Visit    None    Visit Diagnoses    Hydrosalpinx    -  Primary   Adenomyosis       Elevated BP without diagnosis of hypertension          1) AUB-A - has failed depo provera, we discussed trial of Mirena, Novasure endometrial ablation, and hysterectomy. I also suspect possible endometriosis given pelvic pain and hydrosalpinx (no history of prior BTL, no history of PID with negative GC/CT in the past 3 months, as well as chronicity).  She is not interested in Mirena IUD and given her failure on depo provera I do not expect adequate cycle control on this.  Novasure may have higher failure rates than the quoted 1/4 at 4 years in the setting of adenomyosis and given patient's age is unlikely to transition her into menopause.  Post TLH, BS, and cystoscopy - Endometrial biopsy preoperative  2)A total of 15 minutes were spent in face-to-face contact with the patient during this encounter with over half of that time devoted to counseling and coordination of care.  3) Return if symptoms worsen or fail to improve.    Malachy Mood, MD, [facility], [place]

## 2018-02-01 ENCOUNTER — Ambulatory Visit (INDEPENDENT_AMBULATORY_CARE_PROVIDER_SITE_OTHER): Payer: BLUE CROSS/BLUE SHIELD

## 2018-02-01 DIAGNOSIS — Z3042 Encounter for surveillance of injectable contraceptive: Secondary | ICD-10-CM

## 2018-02-01 MED ORDER — MEDROXYPROGESTERONE ACETATE 150 MG/ML IM SUSP
150.0000 mg | Freq: Once | INTRAMUSCULAR | Status: AC
  Administered 2018-02-01: 150 mg via INTRAMUSCULAR

## 2018-02-02 DIAGNOSIS — R102 Pelvic and perineal pain: Secondary | ICD-10-CM

## 2018-02-02 DIAGNOSIS — N8003 Adenomyosis of the uterus: Secondary | ICD-10-CM | POA: Insufficient documentation

## 2018-02-02 DIAGNOSIS — N809 Endometriosis, unspecified: Secondary | ICD-10-CM

## 2018-02-02 DIAGNOSIS — N939 Abnormal uterine and vaginal bleeding, unspecified: Secondary | ICD-10-CM | POA: Insufficient documentation

## 2018-02-02 DIAGNOSIS — G8929 Other chronic pain: Secondary | ICD-10-CM | POA: Insufficient documentation

## 2018-02-02 DIAGNOSIS — N7011 Chronic salpingitis: Secondary | ICD-10-CM | POA: Insufficient documentation

## 2018-02-03 ENCOUNTER — Telehealth: Payer: Self-pay | Admitting: Obstetrics and Gynecology

## 2018-02-03 NOTE — Telephone Encounter (Signed)
Patient is aware of H&P/End Bx at [facility] on 04/05/18 @ 9:50am w/ Dr Georgianne Fick, Pre-admit Testing afterwards, and OR on 04/14/18. Patient is aware she may receive calls from the [place] and [facility]. Patient confirmed BCBS and no secondary insurance. Patient may have a change of insurance prior to surgery, possibly [employer], but isn't sure. Patient is aware to call me ASAP if change of insurance so authorization can be obtained. Patient has my phone# and ext.

## 2018-02-03 NOTE — Telephone Encounter (Signed)
Lmtrc

## 2018-02-03 NOTE — Telephone Encounter (Signed)
-----   Message from Malachy Mood, MD sent at 02/02/2018  8:58 AM EDT ----- Regarding: surgery Surgery Date:   LOS: observation  Surgery Booking Request Patient Full Name: Lisa Peters MRN: [id number]  DOB: 07-Aug-1980  Surgeon: Malachy Mood, MD  Requested Surgery Date and Time: 2-4 weeks Primary Diagnosis and Code: Abnormal uterine bleeding Secondary Diagnosis and Code: Adenomyosis, chronic pelvic pain, hydrosalpinx Surgical Procedure: TLH/BS and cystoscopy L&D Notification:N/A Admission Status: observation Length of Surgery: 2hrs Special Case Needs: none H&P: week prior (date) Phone Interview or Office Pre-Admit: pre-admit Interpreter: No Language: English Medical Clearance: No Special Scheduling Instructions: H&P 1 week prior to obtained endometrial biopsy

## 2018-02-08 DIAGNOSIS — K2970 Gastritis, unspecified, without bleeding: Secondary | ICD-10-CM | POA: Diagnosis not present

## 2018-02-08 DIAGNOSIS — R1013 Epigastric pain: Secondary | ICD-10-CM | POA: Diagnosis not present

## 2018-02-08 DIAGNOSIS — K449 Diaphragmatic hernia without obstruction or gangrene: Secondary | ICD-10-CM | POA: Diagnosis not present

## 2018-02-08 DIAGNOSIS — K228 Other specified diseases of esophagus: Secondary | ICD-10-CM | POA: Diagnosis not present

## 2018-02-08 DIAGNOSIS — K3189 Other diseases of stomach and duodenum: Secondary | ICD-10-CM | POA: Diagnosis not present

## 2018-02-14 DIAGNOSIS — Z7689 Persons encountering health services in other specified circumstances: Secondary | ICD-10-CM | POA: Diagnosis not present

## 2018-02-14 DIAGNOSIS — Z23 Encounter for immunization: Secondary | ICD-10-CM | POA: Diagnosis not present

## 2018-02-14 DIAGNOSIS — K449 Diaphragmatic hernia without obstruction or gangrene: Secondary | ICD-10-CM | POA: Insufficient documentation

## 2018-02-14 DIAGNOSIS — K209 Esophagitis, unspecified without bleeding: Secondary | ICD-10-CM | POA: Insufficient documentation

## 2018-02-14 DIAGNOSIS — M1380 Other specified arthritis, unspecified site: Secondary | ICD-10-CM | POA: Diagnosis not present

## 2018-02-14 DIAGNOSIS — F418 Other specified anxiety disorders: Secondary | ICD-10-CM | POA: Diagnosis not present

## 2018-02-15 DIAGNOSIS — F4310 Post-traumatic stress disorder, unspecified: Secondary | ICD-10-CM | POA: Diagnosis not present

## 2018-02-15 DIAGNOSIS — F331 Major depressive disorder, recurrent, moderate: Secondary | ICD-10-CM | POA: Diagnosis not present

## 2018-02-15 DIAGNOSIS — F411 Generalized anxiety disorder: Secondary | ICD-10-CM | POA: Diagnosis not present

## 2018-02-17 DIAGNOSIS — R5383 Other fatigue: Secondary | ICD-10-CM | POA: Diagnosis not present

## 2018-02-17 DIAGNOSIS — M2560 Stiffness of unspecified joint, not elsewhere classified: Secondary | ICD-10-CM | POA: Diagnosis not present

## 2018-02-17 DIAGNOSIS — M1380 Other specified arthritis, unspecified site: Secondary | ICD-10-CM | POA: Diagnosis not present

## 2018-02-17 DIAGNOSIS — E559 Vitamin D deficiency, unspecified: Secondary | ICD-10-CM | POA: Diagnosis not present

## 2018-02-17 DIAGNOSIS — Z1589 Genetic susceptibility to other disease: Secondary | ICD-10-CM | POA: Diagnosis not present

## 2018-02-21 DIAGNOSIS — F4310 Post-traumatic stress disorder, unspecified: Secondary | ICD-10-CM | POA: Diagnosis not present

## 2018-02-21 DIAGNOSIS — F331 Major depressive disorder, recurrent, moderate: Secondary | ICD-10-CM | POA: Diagnosis not present

## 2018-02-21 DIAGNOSIS — F411 Generalized anxiety disorder: Secondary | ICD-10-CM | POA: Diagnosis not present

## 2018-02-22 DIAGNOSIS — F332 Major depressive disorder, recurrent severe without psychotic features: Secondary | ICD-10-CM | POA: Diagnosis not present

## 2018-02-22 DIAGNOSIS — F4312 Post-traumatic stress disorder, chronic: Secondary | ICD-10-CM | POA: Diagnosis not present

## 2018-02-22 DIAGNOSIS — F3181 Bipolar II disorder: Secondary | ICD-10-CM | POA: Diagnosis not present

## 2018-02-22 DIAGNOSIS — F411 Generalized anxiety disorder: Secondary | ICD-10-CM | POA: Diagnosis not present

## 2018-03-21 DIAGNOSIS — F411 Generalized anxiety disorder: Secondary | ICD-10-CM | POA: Diagnosis not present

## 2018-03-21 DIAGNOSIS — F4310 Post-traumatic stress disorder, unspecified: Secondary | ICD-10-CM | POA: Diagnosis not present

## 2018-03-21 DIAGNOSIS — F331 Major depressive disorder, recurrent, moderate: Secondary | ICD-10-CM | POA: Diagnosis not present

## 2018-03-22 DIAGNOSIS — F411 Generalized anxiety disorder: Secondary | ICD-10-CM | POA: Diagnosis not present

## 2018-03-22 DIAGNOSIS — F4312 Post-traumatic stress disorder, chronic: Secondary | ICD-10-CM | POA: Diagnosis not present

## 2018-03-22 DIAGNOSIS — F3181 Bipolar II disorder: Secondary | ICD-10-CM | POA: Diagnosis not present

## 2018-03-22 DIAGNOSIS — F332 Major depressive disorder, recurrent severe without psychotic features: Secondary | ICD-10-CM | POA: Diagnosis not present

## 2018-03-28 DIAGNOSIS — F411 Generalized anxiety disorder: Secondary | ICD-10-CM | POA: Diagnosis not present

## 2018-03-28 DIAGNOSIS — F331 Major depressive disorder, recurrent, moderate: Secondary | ICD-10-CM | POA: Diagnosis not present

## 2018-03-28 DIAGNOSIS — F4310 Post-traumatic stress disorder, unspecified: Secondary | ICD-10-CM | POA: Diagnosis not present

## 2018-03-30 ENCOUNTER — Telehealth: Payer: Self-pay

## 2018-03-30 NOTE — Telephone Encounter (Signed)
FMLA/DISABILITY form for [employer] filled out, signature obtained and given to [place] for processing.

## 2018-04-04 DIAGNOSIS — F4310 Post-traumatic stress disorder, unspecified: Secondary | ICD-10-CM | POA: Diagnosis not present

## 2018-04-04 DIAGNOSIS — F331 Major depressive disorder, recurrent, moderate: Secondary | ICD-10-CM | POA: Diagnosis not present

## 2018-04-04 DIAGNOSIS — F411 Generalized anxiety disorder: Secondary | ICD-10-CM | POA: Diagnosis not present

## 2018-04-05 ENCOUNTER — Other Ambulatory Visit: Payer: Self-pay

## 2018-04-05 ENCOUNTER — Other Ambulatory Visit (HOSPITAL_COMMUNITY)
Admission: RE | Admit: 2018-04-05 | Discharge: 2018-04-05 | Disposition: A | Discharge status: Home / Self Care | Payer: BLUE CROSS/BLUE SHIELD | Source: Ambulatory Visit | Attending: Obstetrics and Gynecology | Admitting: Obstetrics and Gynecology

## 2018-04-05 ENCOUNTER — Encounter
Admission: RE | Admit: 2018-04-05 | Discharge: 2018-04-05 | Disposition: A | Discharge status: Home / Self Care | Payer: BLUE CROSS/BLUE SHIELD | Source: Ambulatory Visit | Attending: Obstetrics and Gynecology | Admitting: Obstetrics and Gynecology

## 2018-04-05 ENCOUNTER — Encounter: Payer: Self-pay | Admitting: Obstetrics and Gynecology

## 2018-04-05 ENCOUNTER — Ambulatory Visit (INDEPENDENT_AMBULATORY_CARE_PROVIDER_SITE_OTHER): Payer: BLUE CROSS/BLUE SHIELD | Admitting: Obstetrics and Gynecology

## 2018-04-05 VITALS — BP 132/80 | HR 97 | Ht 64.0 in | Wt 148.0 lb

## 2018-04-05 DIAGNOSIS — Z01818 Encounter for other preprocedural examination: Secondary | ICD-10-CM | POA: Diagnosis not present

## 2018-04-05 DIAGNOSIS — N939 Abnormal uterine and vaginal bleeding, unspecified: Secondary | ICD-10-CM

## 2018-04-05 DIAGNOSIS — Z01812 Encounter for preprocedural laboratory examination: Secondary | ICD-10-CM | POA: Insufficient documentation

## 2018-04-05 LAB — CBC
HCT: 37.7 % (ref 36.0–46.0)
Hemoglobin: 12.3 g/dL (ref 12.0–15.0)
MCH: 33.3 pg (ref 26.0–34.0)
MCHC: 32.6 g/dL (ref 30.0–36.0)
MCV: 102.2 fL — ABNORMAL HIGH (ref 80.0–100.0)
NRBC: 0 % (ref 0.0–0.2)
PLATELETS: 405 10*3/uL — AB (ref 150–400)
RBC: 3.69 MIL/uL — ABNORMAL LOW (ref 3.87–5.11)
RDW: 13.2 % (ref 11.5–15.5)
WBC: 7.3 10*3/uL (ref 4.0–10.5)

## 2018-04-05 LAB — BASIC METABOLIC PANEL
Anion gap: 5 (ref 5–15)
BUN: 9 mg/dL (ref 6–20)
CALCIUM: 8.8 mg/dL — AB (ref 8.9–10.3)
CO2: 27 mmol/L (ref 22–32)
CREATININE: 0.63 mg/dL (ref 0.44–1.00)
Chloride: 110 mmol/L (ref 98–111)
GFR calc non Af Amer: >60 mL/min (ref >60)
Glucose, Bld: 92 mg/dL (ref 70–99)
Potassium: 3.7 mmol/L (ref 3.5–5.1)
SODIUM: 142 mmol/L (ref 135–145)

## 2018-04-05 LAB — TYPE AND SCREEN
ABO/RH(D): A POS
ANTIBODY SCREEN: NEGATIVE

## 2018-04-05 NOTE — Patient Instructions (Signed)
Your procedure is scheduled on: 04/14/18 Report to Day Surgery. [facility] SECOND FLOOR To find out your arrival time please call [phone] between 1PM - 3PM on 04/14/18  Remember: Instructions that are not followed completely may result in serious medical risk,  up to and including death, or upon the discretion of your surgeon and anesthesiologist your  surgery may need to be rescheduled.     _X__ 1. Do not eat food after midnight the night before your procedure.                 No gum chewing or hard candies. You may drink clear liquids up to 2 hours                 before you are scheduled to arrive for your surgery- DO not drink clear                 liquids within 2 hours of the start of your surgery.                 Clear Liquids include:  water, apple juice without pulp, clear carbohydrate                 drink such as Clearfast of Gatorade, Black Coffee or Tea (Do not add                 anything to coffee or tea).  __X__2.  On the morning of surgery brush your teeth with toothpaste and water, you                may rinse your mouth with mouthwash if you wish.  Do not swallow any toothpaste of mouthwash.     _X__ 3.  No Alcohol for 24 hours before or after surgery.   _X__ 4.  Do Not Smoke or use e-cigarettes For 24 Hours Prior to Your Surgery.                 Do not use any chewable tobacco products for at least 6 hours prior to                 surgery.  ____  5.  Bring all medications with you on the day of surgery if instructed.   __X__  6.  Notify your doctor if there is any change in your medical condition      (cold, fever, infections).     Do not wear jewelry, make-up, hairpins, clips or nail polish. Do not wear lotions, powders, or perfumes. You may wear deodorant. Do not shave 48 hours prior to surgery. Men may shave face and neck. Do not bring valuables to the hospital.    [facility] is not responsible for any belongings or  valuables.  Contacts, dentures or bridgework may not be worn into surgery. Leave your suitcase in the car. After surgery it may be brought to your room. For patients admitted to the hospital, discharge time is determined by your treatment team.   Patients discharged the day of surgery will not be allowed to drive home.   Please read over the following fact sheets that you were given:   Surgical Site Infection Prevention   __X__ Take these medicines the morning of surgery with A SIP OF WATER:    1.ALPRAZOLAM  2. PANTOPRAZOLE  3. BREXPIPRAZOLE  4. VILAZALONE  5.  6.  ____ Fleet Enema (as directed)   _X___ Use CHG Soap as directed  ____ Use inhalers on the day of surgery  ____ Stop metformin 2 days prior to surgery    ____ Take 1/2 of usual insulin dose the night before surgery. No insulin the morning          of surgery.   ____ Stop Coumadin/Plavix/aspirin on __X__ Stop Anti-inflammatories on          NO ANTI-INFLAMMATORIES UNTIL AFTER. CAN TAKE TYLENOL   ____ Stop supplements until after surgery.    ____ Bring C-Pap to the hospital.

## 2018-04-05 NOTE — Progress Notes (Signed)
Obstetrics & Gynecology Surgery H&P    Chief Complaint: Scheduled Surgery   History of Present Illness: Patient is a 37 y.o. G3O7564 presenting for scheduled TLH, BS, cystoscopy, for the treatment or further evaluation of AUB.   Prior Treatments prior to proceeding with surgery include: depo provera, prior IUD  Preoperative Pap:01/17/18 Results: NIL HPV negative  Preoperative Endometrial biopsy: 04/05/18 Findings: pathology pending Preoperative Ultrasound: 01/31/2018   Review of Systems:10 point review of systems  Past Medical History:  Past Medical History:  Diagnosis Date  . Anxiety   . Depression    PTSD  . GERD (gastroesophageal reflux disease)   . 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 Rheumatoid arthritis ([place])    Rheumatoid    Past Surgical History:  Past Surgical History:  Procedure Laterality Date  . COLPOSCOPY    . FINGER SURGERY  2014    Family History:  Family History  Problem Relation Age of Onset  . Diabetes Mother   . Arthritis Father   . Diabetes Father        hypoglycemia  . Stroke Paternal Grandmother   . Heart disease Paternal Grandfather     Social History:  Social History   Socioeconomic History  . Marital status: Single    Spouse name: Not on file  . Number of children: Not on file  . Years of education: Not on file  . Highest education level: Not on file  Occupational History  . Not on file  Social Needs  . Financial resource strain: Not on file  . Food insecurity:    Worry: Not on file    Inability: Not on file  . Transportation needs:    Medical: Not on file    Non-medical: Not on file  Tobacco Use  . Smoking status: Former Smoker    Types: Cigarettes  . Smokeless tobacco: Never Used  . Tobacco comment: quit 2015  Substance and Sexual Activity  . Alcohol use: Yes    Comment: occassionally  . Drug use: No  . Sexual activity: Yes    Birth control/protection: Injection  Lifestyle  .  Physical activity:    Days per week: Not on file    Minutes per session: Not on file  . Stress: Not on file  Relationships  . Social connections:    Talks on phone: Not on file    Gets together: Not on file    Attends religious service: Not on file    Active member of club or organization: Not on file    Attends meetings of clubs or organizations: Not on file    Relationship status: Not on file  . Intimate partner violence:    Fear of current or ex partner: Not on file    Emotionally abused: Not on file    Physically abused: Not on file    Forced sexual activity: Not on file  Other Topics Concern  . Not on file  Social History Narrative  . Not on file    Allergies:  No Known Allergies  Medications: Prior to Admission medications   Medication Sig Start Date End Date Taking? Authorizing Provider  Adalimumab (HUMIRA) 40 MG/0.8ML PSKT Inject 40 mg into the skin every 14 (fourteen) days.    Yes [provider]  ALPRAZolam Duanne Moron) 0.5 MG tablet Take 0.5 mg by mouth 2 (two) times daily.   Yes [provider]  Brexpiprazole (REXULTI) 1 MG TABS Take 1 mg by mouth  daily.    Yes [provider]  Cholecalciferol (VITAMIN D) 2000 units CAPS Take 2,000 Units by mouth daily.   Yes [provider]  ibuprofen (ADVIL,MOTRIN) 200 MG tablet Take 800 mg by mouth daily.    Yes [provider]  medroxyPROGESTERone (DEPO-PROVERA) 150 MG/ML injection INJECT 1 ML (150 MG TOTAL) INTO THE MUSCLE EVERY 3 (THREE) MONTHS. 01/08/18 04/08/18 Yes Malachy Mood, MD  mirtazapine (REMERON) 30 MG tablet Take 30 mg by mouth at bedtime.   Yes [provider]  pantoprazole (PROTONIX) 40 MG tablet Take 1 tablet (40 mg total) by mouth daily. Patient taking differently: Take 40 mg by mouth 2 (two) times daily.  08/17/17  Yes Juline Patch, MD  Vilazodone HCl (VIIBRYD) 40 MG TABS Take 40 mg by mouth daily.   Yes [provider]    Physical Exam Vitals:  Blood pressure 132/80, pulse 97, height 5\' 4"  (1.626 m), weight 148 lb (67.1 kg). General: NAD HEENT: normocephalic, anicteric Pulmonary: No increased work of breathing Cardiovascular: RRR, distal pulses 2+ Abdomen: soft, non-tender, non-distended Genitourinary: normal external female genitalia, mobile uterus, normal cervix Extremities: no edema, erythema, or tenderness Neurologic: Grossly intact Psychiatric: mood appropriate, affect full   ENDOMETRIAL BIOPSY     The indications for endometrial biopsy were reviewed.   Risks of the biopsy including cramping, bleeding, infection, uterine perforation, inadequate specimen and need for additional procedures  were discussed. The patient states she understands and agrees to undergo procedure today. Consent was signed. Time out was performed. Urine HCG was negative. A Graves speculum was placed and the cervix was brought into view.  The cervix was prepped with Betadine. A single-toothed tenaculum was  placed on the anterior lip of the cervix for traction. A 3 mm pipelle was introduced through the cervix into the endometrial cavity without difficulty to a depth of 8cm, and a small amount of tissue was obtained, the resulting specime sent to pathology. The instruments were removed from the patient's vagina. Minimal bleeding from the cervix was noted. The patient tolerated the procedure well. Routine post-procedure instructions were given to the patient.  She will be contacted by phone one results become available.      Imaging No results found.  Assessment: 37 y.o. E7O3500 presenting for scheduled TLH, BS, cystoscopy  Plan: 1) Patient opts for definitive surgical management via hysterectomy. The risks of surgery were discussed in detail with the patient including but not limited to: bleeding which may require transfusion or reoperation; infection which may require antibiotics; injury to bowel, bladder, ureters or other surrounding organs (With a  literature reported rate of urinary tract injury of 1% quoted); need for additional procedures including laparotomy; thromboembolic phenomenon, incisional problems and other postoperative/anesthesia complications.  Patient was also advised that recovery procedure generally involves an overnight stay; and the  expected recovery time after a hysterectomy being in the range of 6-8 weeks.  Likelihood of success in alleviating the patient's symptoms was discussed.  While definitive in regards to issues with menstural bleeding, pelvic pain if present preoperatively may continue and in fact worsen postoperatively.  She is aware that the procedure will render her unable to pursue childbearing in the future.   She was told that she will be contacted by our surgical scheduler regarding the time and date of her surgery; routine preoperative instructions of having nothing to eat or drink after midnight on the day prior to surgery and also coming to the hospital 1.5 hours prior to  her time of surgery were also emphasized.  She was told she may be called for a preoperative appointment about a week prior to surgery and will be given further preoperative instructions at that visit.  Routine postoperative instructions will be reviewed with the patient and her family in detail after surgery. Printed patient education handouts about the procedure was given to the patient to review at home.  2) Routine postoperative instructions were reviewed with the patient and her family in detail today including the expected length of recovery and likely postoperative course.  The patient concurred with the proposed plan, giving informed written consent for the surgery today.  Patient instructed on the importance of being NPO after midnight prior to her procedure.  If warranted preoperative prophylactic antibiotics and SCDs ordered on call to the OR to meet SCIP guidelines and adhere to recommendation laid forth in [place] Number  104 May 2009  "Antibiotic Prophylaxis for Gynecologic Procedures".     Malachy Mood, MD, [facility], [facility] 04/05/2018, 10:19 AM

## 2018-04-11 DIAGNOSIS — F331 Major depressive disorder, recurrent, moderate: Secondary | ICD-10-CM | POA: Diagnosis not present

## 2018-04-11 DIAGNOSIS — F4310 Post-traumatic stress disorder, unspecified: Secondary | ICD-10-CM | POA: Diagnosis not present

## 2018-04-11 DIAGNOSIS — F411 Generalized anxiety disorder: Secondary | ICD-10-CM | POA: Diagnosis not present

## 2018-04-13 MED ORDER — CEFAZOLIN SODIUM-DEXTROSE 2-4 GM/100ML-% IV SOLN
2.0000 g | INTRAVENOUS | Status: AC
  Administered 2018-04-14: 2 g via INTRAVENOUS

## 2018-04-14 ENCOUNTER — Encounter
Admission: RE | Disposition: A | Discharge status: Home / Self Care | Payer: Self-pay | Source: Ambulatory Visit | Attending: Obstetrics and Gynecology

## 2018-04-14 ENCOUNTER — Ambulatory Visit: Payer: BLUE CROSS/BLUE SHIELD | Admitting: Certified Registered Nurse Anesthetist

## 2018-04-14 ENCOUNTER — Other Ambulatory Visit: Payer: Self-pay

## 2018-04-14 ENCOUNTER — Observation Stay
Admission: RE | Admit: 2018-04-14 | Discharge: 2018-04-15 | Disposition: A | Discharge status: Home / Self Care | Payer: BLUE CROSS/BLUE SHIELD | Source: Ambulatory Visit | Attending: Obstetrics and Gynecology | Admitting: Obstetrics and Gynecology

## 2018-04-14 ENCOUNTER — Encounter: Payer: Self-pay | Admitting: *Deleted

## 2018-04-14 DIAGNOSIS — G894 Chronic pain syndrome: Secondary | ICD-10-CM

## 2018-04-14 DIAGNOSIS — Z791 Long term (current) use of non-steroidal anti-inflammatories (NSAID): Secondary | ICD-10-CM | POA: Insufficient documentation

## 2018-04-14 DIAGNOSIS — K219 Gastro-esophageal reflux disease without esophagitis: Secondary | ICD-10-CM | POA: Diagnosis not present

## 2018-04-14 DIAGNOSIS — Z793 Long term (current) use of hormonal contraceptives: Secondary | ICD-10-CM | POA: Insufficient documentation

## 2018-04-14 DIAGNOSIS — N838 Other noninflammatory disorders of ovary, fallopian tube and broad ligament: Secondary | ICD-10-CM | POA: Diagnosis not present

## 2018-04-14 DIAGNOSIS — Z90710 Acquired absence of both cervix and uterus: Secondary | ICD-10-CM | POA: Diagnosis present

## 2018-04-14 DIAGNOSIS — R102 Pelvic and perineal pain: Secondary | ICD-10-CM | POA: Diagnosis not present

## 2018-04-14 DIAGNOSIS — N946 Dysmenorrhea, unspecified: Secondary | ICD-10-CM | POA: Insufficient documentation

## 2018-04-14 DIAGNOSIS — F329 Major depressive disorder, single episode, unspecified: Secondary | ICD-10-CM | POA: Diagnosis not present

## 2018-04-14 DIAGNOSIS — N939 Abnormal uterine and vaginal bleeding, unspecified: Secondary | ICD-10-CM | POA: Diagnosis not present

## 2018-04-14 DIAGNOSIS — F419 Anxiety disorder, unspecified: Secondary | ICD-10-CM | POA: Diagnosis not present

## 2018-04-14 DIAGNOSIS — Z79899 Other long term (current) drug therapy: Secondary | ICD-10-CM | POA: Diagnosis not present

## 2018-04-14 DIAGNOSIS — N800 Endometriosis of uterus: Secondary | ICD-10-CM | POA: Insufficient documentation

## 2018-04-14 DIAGNOSIS — N938 Other specified abnormal uterine and vaginal bleeding: Secondary | ICD-10-CM | POA: Diagnosis not present

## 2018-04-14 DIAGNOSIS — G8929 Other chronic pain: Secondary | ICD-10-CM | POA: Diagnosis not present

## 2018-04-14 HISTORY — PX: LAPAROSCOPIC HYSTERECTOMY: SHX1926

## 2018-04-14 HISTORY — PX: CYSTOSCOPY: SHX5120

## 2018-04-14 LAB — ABO/RH: ABO/RH(D): A POS

## 2018-04-14 LAB — POCT PREGNANCY, URINE: Preg Test, Ur: NEGATIVE

## 2018-04-14 SURGERY — HYSTERECTOMY, TOTAL, LAPAROSCOPIC
Anesthesia: General

## 2018-04-14 MED ORDER — MORPHINE SULFATE (PF) 2 MG/ML IV SOLN: 1.0000 mg | INTRAVENOUS | Status: DC | PRN

## 2018-04-14 MED ORDER — DEXAMETHASONE SODIUM PHOSPHATE 10 MG/ML IJ SOLN
INTRAMUSCULAR | Status: AC
  Filled 2018-04-14: qty 1

## 2018-04-14 MED ORDER — ONDANSETRON HCL 4 MG/2ML IJ SOLN: 4.0000 mg | Freq: Four times a day (QID) | INTRAMUSCULAR | Status: DC | PRN

## 2018-04-14 MED ORDER — DEXTROSE-NACL 5-0.45 % IV SOLN
INTRAVENOUS | Status: DC
  Administered 2018-04-14 – 2018-04-15 (×2): via INTRAVENOUS

## 2018-04-14 MED ORDER — ROCURONIUM BROMIDE 50 MG/5ML IV SOLN
INTRAVENOUS | Status: AC
  Filled 2018-04-14: qty 1

## 2018-04-14 MED ORDER — LIDOCAINE HCL (PF) 2 % IJ SOLN
INTRAMUSCULAR | Status: AC
  Filled 2018-04-14: qty 10

## 2018-04-14 MED ORDER — ACETAMINOPHEN 10 MG/ML IV SOLN
INTRAVENOUS | Status: DC | PRN
  Administered 2018-04-14: 1000 mg via INTRAVENOUS

## 2018-04-14 MED ORDER — ACETAMINOPHEN 325 MG PO TABS: 325.0000 mg | ORAL_TABLET | ORAL | Status: DC | PRN

## 2018-04-14 MED ORDER — MIDAZOLAM HCL 2 MG/2ML IJ SOLN
INTRAMUSCULAR | Status: DC | PRN
  Administered 2018-04-14: 2 mg via INTRAVENOUS

## 2018-04-14 MED ORDER — MEPERIDINE HCL 50 MG/ML IJ SOLN: 6.2500 mg | INTRAMUSCULAR | Status: DC | PRN

## 2018-04-14 MED ORDER — HYDROCODONE-ACETAMINOPHEN 7.5-325 MG PO TABS: 1.0000 | ORAL_TABLET | Freq: Once | ORAL | Status: DC | PRN

## 2018-04-14 MED ORDER — OXYCODONE-ACETAMINOPHEN 5-325 MG PO TABS
1.0000 | ORAL_TABLET | ORAL | Status: DC | PRN
  Administered 2018-04-15: 1 via ORAL
  Filled 2018-04-14: qty 1

## 2018-04-14 MED ORDER — LACTATED RINGERS IV SOLN
INTRAVENOUS | Status: DC
  Administered 2018-04-14 (×2): via INTRAVENOUS

## 2018-04-14 MED ORDER — ACETAMINOPHEN 160 MG/5ML PO SOLN: 325.0000 mg | ORAL | Status: DC | PRN

## 2018-04-14 MED ORDER — MIRTAZAPINE 30 MG PO TABS
30.0000 mg | ORAL_TABLET | Freq: Every day | ORAL | Status: DC
  Administered 2018-04-14: 30 mg via ORAL
  Filled 2018-04-14 (×2): qty 1

## 2018-04-14 MED ORDER — EPHEDRINE SULFATE 50 MG/ML IJ SOLN
INTRAMUSCULAR | Status: DC | PRN
  Administered 2018-04-14 (×2): 5 mg via INTRAVENOUS

## 2018-04-14 MED ORDER — MENTHOL 3 MG MT LOZG
1.0000 | LOZENGE | OROMUCOSAL | Status: DC | PRN
  Filled 2018-04-14: qty 9

## 2018-04-14 MED ORDER — LIDOCAINE HCL 4 % MT SOLN
OROMUCOSAL | Status: DC | PRN
  Administered 2018-04-14: 4 mL via TOPICAL

## 2018-04-14 MED ORDER — PROPOFOL 10 MG/ML IV BOLUS
INTRAVENOUS | Status: DC | PRN
  Administered 2018-04-14: 140 mg via INTRAVENOUS

## 2018-04-14 MED ORDER — KETOROLAC TROMETHAMINE 30 MG/ML IJ SOLN: 30.0000 mg | Freq: Once | INTRAMUSCULAR | Status: DC | PRN

## 2018-04-14 MED ORDER — MIDAZOLAM HCL 2 MG/2ML IJ SOLN
INTRAMUSCULAR | Status: AC
  Filled 2018-04-14: qty 2

## 2018-04-14 MED ORDER — PROPOFOL 10 MG/ML IV BOLUS
INTRAVENOUS | Status: AC
  Filled 2018-04-14: qty 20

## 2018-04-14 MED ORDER — FENTANYL CITRATE (PF) 100 MCG/2ML IJ SOLN: 25.0000 ug | INTRAMUSCULAR | Status: DC | PRN

## 2018-04-14 MED ORDER — ONDANSETRON HCL 4 MG/2ML IJ SOLN
INTRAMUSCULAR | Status: AC
  Filled 2018-04-14: qty 2

## 2018-04-14 MED ORDER — FENTANYL CITRATE (PF) 250 MCG/5ML IJ SOLN
INTRAMUSCULAR | Status: AC
  Filled 2018-04-14: qty 5

## 2018-04-14 MED ORDER — PROMETHAZINE HCL 25 MG/ML IJ SOLN: 6.2500 mg | INTRAMUSCULAR | Status: DC | PRN

## 2018-04-14 MED ORDER — KETOROLAC TROMETHAMINE 30 MG/ML IJ SOLN
30.0000 mg | Freq: Four times a day (QID) | INTRAMUSCULAR | Status: DC | PRN
  Administered 2018-04-15: 30 mg via INTRAVENOUS
  Filled 2018-04-14: qty 1

## 2018-04-14 MED ORDER — LIDOCAINE HCL (CARDIAC) PF 100 MG/5ML IV SOSY
PREFILLED_SYRINGE | INTRAVENOUS | Status: DC | PRN
  Administered 2018-04-14: 60 mg via INTRAVENOUS

## 2018-04-14 MED ORDER — VILAZODONE HCL 40 MG PO TABS
40.0000 mg | ORAL_TABLET | Freq: Every day | ORAL | Status: DC
  Filled 2018-04-14: qty 1

## 2018-04-14 MED ORDER — CEFAZOLIN SODIUM-DEXTROSE 2-4 GM/100ML-% IV SOLN
INTRAVENOUS | Status: AC
  Filled 2018-04-14: qty 100

## 2018-04-14 MED ORDER — BREXPIPRAZOLE 1 MG PO TABS
1.0000 mg | ORAL_TABLET | Freq: Every day | ORAL | Status: DC
  Administered 2018-04-14: 1 mg via ORAL
  Filled 2018-04-14: qty 1

## 2018-04-14 MED ORDER — ACETAMINOPHEN NICU IV SYRINGE 10 MG/ML
INTRAVENOUS | Status: AC
  Filled 2018-04-14: qty 1

## 2018-04-14 MED ORDER — PHENYLEPHRINE HCL 10 MG/ML IJ SOLN
INTRAMUSCULAR | Status: DC | PRN
  Administered 2018-04-14: 100 ug via INTRAVENOUS

## 2018-04-14 MED ORDER — ALPRAZOLAM 0.5 MG PO TABS
0.5000 mg | ORAL_TABLET | Freq: Two times a day (BID) | ORAL | Status: DC
  Administered 2018-04-15: 0.5 mg via ORAL
  Filled 2018-04-14: qty 1

## 2018-04-14 MED ORDER — SUGAMMADEX SODIUM 200 MG/2ML IV SOLN
INTRAVENOUS | Status: AC
  Filled 2018-04-14: qty 2

## 2018-04-14 MED ORDER — SUGAMMADEX SODIUM 200 MG/2ML IV SOLN
INTRAVENOUS | Status: DC | PRN
  Administered 2018-04-14: 140 mg via INTRAVENOUS

## 2018-04-14 MED ORDER — BUPIVACAINE HCL 0.5 % IJ SOLN
INTRAMUSCULAR | Status: DC | PRN
  Administered 2018-04-14: 10 mL

## 2018-04-14 MED ORDER — KETOROLAC TROMETHAMINE 30 MG/ML IJ SOLN
INTRAMUSCULAR | Status: AC
  Filled 2018-04-14: qty 1

## 2018-04-14 MED ORDER — ONDANSETRON HCL 4 MG/2ML IJ SOLN
INTRAMUSCULAR | Status: DC | PRN
  Administered 2018-04-14: 4 mg via INTRAVENOUS

## 2018-04-14 MED ORDER — SIMETHICONE 80 MG PO CHEW: 80.0000 mg | CHEWABLE_TABLET | Freq: Four times a day (QID) | ORAL | Status: DC | PRN

## 2018-04-14 MED ORDER — FENTANYL CITRATE (PF) 100 MCG/2ML IJ SOLN
INTRAMUSCULAR | Status: DC | PRN
  Administered 2018-04-14 (×5): 50 ug via INTRAVENOUS

## 2018-04-14 MED ORDER — DEXAMETHASONE SODIUM PHOSPHATE 10 MG/ML IJ SOLN
INTRAMUSCULAR | Status: DC | PRN
  Administered 2018-04-14: 8 mg via INTRAVENOUS

## 2018-04-14 MED ORDER — BUPIVACAINE HCL (PF) 0.5 % IJ SOLN
INTRAMUSCULAR | Status: AC
  Filled 2018-04-14: qty 30

## 2018-04-14 MED ORDER — KETOROLAC TROMETHAMINE 30 MG/ML IJ SOLN
INTRAMUSCULAR | Status: DC | PRN
  Administered 2018-04-14: 30 mg via INTRAVENOUS

## 2018-04-14 MED ORDER — ROCURONIUM BROMIDE 100 MG/10ML IV SOLN
INTRAVENOUS | Status: DC | PRN
  Administered 2018-04-14 (×2): 10 mg via INTRAVENOUS
  Administered 2018-04-14: 40 mg via INTRAVENOUS

## 2018-04-14 SURGICAL SUPPLY — 56 items
APPLICATOR ARISTA FLEXITIP XL (MISCELLANEOUS) ×3 IMPLANT
BAG URINE DRAINAGE (UROLOGICAL SUPPLIES) ×3 IMPLANT
BLADE SURG SZ11 CARB STEEL (BLADE) ×3 IMPLANT
CANISTER SUCT 1200ML W/VALVE (MISCELLANEOUS) ×3 IMPLANT
CATH FOLEY 2WAY  5CC 16FR (CATHETERS) ×2
CATH URTH 16FR FL 2W BLN LF (CATHETERS) ×1 IMPLANT
CHLORAPREP W/TINT 26ML (MISCELLANEOUS) ×3 IMPLANT
COUNTER NEEDLE 20/40 LG (NEEDLE) ×3 IMPLANT
COVER WAND RF STERILE (DRAPES) ×3 IMPLANT
DEFOGGER SCOPE WARMER CLEARIFY (MISCELLANEOUS) ×3 IMPLANT
DERMABOND ADVANCED (GAUZE/BANDAGES/DRESSINGS) ×2
DERMABOND ADVANCED .7 DNX12 (GAUZE/BANDAGES/DRESSINGS) ×1 IMPLANT
GAUZE 4X4 16PLY RFD (DISPOSABLE) ×6 IMPLANT
GLOVE BIO SURGEON STRL SZ7 (GLOVE) ×12 IMPLANT
GLOVE INDICATOR 7.5 STRL GRN (GLOVE) ×12 IMPLANT
GOWN STRL REUS W/ TWL LRG LVL3 (GOWN DISPOSABLE) ×2 IMPLANT
GOWN STRL REUS W/ TWL XL LVL3 (GOWN DISPOSABLE) ×1 IMPLANT
GOWN STRL REUS W/TWL LRG LVL3 (GOWN DISPOSABLE) ×4
GOWN STRL REUS W/TWL XL LVL3 (GOWN DISPOSABLE) ×2
HANDLE YANKAUER SUCT BULB TIP (MISCELLANEOUS) ×3 IMPLANT
HEMOSTAT ARISTA ABSORB 1G (MISCELLANEOUS) ×3 IMPLANT
IRRIGATION STRYKERFLOW (MISCELLANEOUS) ×1 IMPLANT
IRRIGATOR STRYKERFLOW (MISCELLANEOUS) ×3
IV LACTATED RINGERS 1000ML (IV SOLUTION) ×3 IMPLANT
IV NS 1000ML (IV SOLUTION) ×2
IV NS 1000ML BAXH (IV SOLUTION) ×1 IMPLANT
KIT PINK PAD W/HEAD ARE REST (MISCELLANEOUS) ×3
KIT PINK PAD W/HEAD ARM REST (MISCELLANEOUS) ×1 IMPLANT
LABEL OR SOLS (LABEL) ×3 IMPLANT
MANIPULATOR VCARE LG CRV RETR (MISCELLANEOUS) ×3 IMPLANT
MANIPULATOR VCARE SML CRV RETR (MISCELLANEOUS) IMPLANT
MANIPULATOR VCARE STD CRV RETR (MISCELLANEOUS) IMPLANT
NS IRRIG 1000ML POUR BTL (IV SOLUTION) ×3 IMPLANT
NS IRRIG 500ML POUR BTL (IV SOLUTION) ×3 IMPLANT
OCCLUDER COLPOPNEUMO (BALLOONS) IMPLANT
PACK GYN LAPAROSCOPIC (MISCELLANEOUS) ×3 IMPLANT
PAD OB MATERNITY 4.3X12.25 (PERSONAL CARE ITEMS) ×3 IMPLANT
PAD PREP 24X41 OB/GYN DISP (PERSONAL CARE ITEMS) ×3 IMPLANT
SET CYSTO W/LG BORE CLAMP LF (SET/KITS/TRAYS/PACK) ×3 IMPLANT
SHEARS HARMONIC ACE PLUS 36CM (ENDOMECHANICALS) ×3 IMPLANT
SLEEVE ENDOPATH XCEL 5M (ENDOMECHANICALS) ×6 IMPLANT
SPONGE LAP 18X18 RF (DISPOSABLE) ×3 IMPLANT
STRAP SAFETY 5IN WIDE (MISCELLANEOUS) ×3 IMPLANT
SURGILUBE 2OZ TUBE FLIPTOP (MISCELLANEOUS) ×3 IMPLANT
SUT MNCRL 4-0 (SUTURE) ×4
SUT MNCRL 4-0 27XMFL (SUTURE) ×2
SUT VIC AB 0 CT1 27 (SUTURE) ×4
SUT VIC AB 0 CT1 27XCR 8 STRN (SUTURE) ×2 IMPLANT
SUT VIC AB 2-0 UR6 27 (SUTURE) ×3 IMPLANT
SUTURE MNCRL 4-0 27XMF (SUTURE) ×2 IMPLANT
SYR 10ML LL (SYRINGE) ×3 IMPLANT
SYR 50ML LL SCALE MARK (SYRINGE) ×3 IMPLANT
TROCAR XCEL NON-BLD 5MMX100MML (ENDOMECHANICALS) ×3 IMPLANT
TUBING CONNECTING 10 (TUBING) ×2 IMPLANT
TUBING CONNECTING 10' (TUBING) ×1
TUBING INSUF HEATED (TUBING) ×3 IMPLANT

## 2018-04-14 NOTE — Anesthesia Preprocedure Evaluation (Signed)
Anesthesia Evaluation  Patient identified by MRN, date of birth, ID band Patient awake    Reviewed: Allergy & Precautions, H&P , NPO status , reviewed documented beta blocker date and time   Airway Mallampati: II  TM Distance: >3 FB Neck ROM: full    Dental  (+) Teeth Intact   Pulmonary former smoker,    Pulmonary exam normal        Cardiovascular Normal cardiovascular exam     Neuro/Psych  Headaches, PSYCHIATRIC DISORDERS Anxiety Depression  Neuromuscular disease    GI/Hepatic GERD  Medicated and Controlled,  Endo/Other    Renal/GU      Musculoskeletal  (+) Arthritis ,   Abdominal   Peds  Hematology   Anesthesia Other Findings Past Medical History: No date: Anxiety No date: Depression     Comment:  PTSD No date: GERD (gastroesophageal reflux disease) No date: 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 No date: Rheumatoid arthritis ([place])     Comment:  Rheumatoid Past Surgical History: No date: COLPOSCOPY 2014: FINGER SURGERY BMI    Body Mass Index:  25.05 kg/m     Reproductive/Obstetrics                             Anesthesia Physical Anesthesia Plan  ASA: II  Anesthesia Plan: General and General ETT   Post-op Pain Management:    Induction: Intravenous  PONV Risk Score and Plan: 4 or greater and Ondansetron, Treatment may vary due to age or medical condition, Midazolam and Metaclopromide  Airway Management Planned: Oral ETT  Additional Equipment:   Intra-op Plan:   Post-operative Plan: Extubation in OR  Informed Consent: I have reviewed the patients History and Physical, chart, labs and discussed the procedure including the risks, benefits and alternatives for the proposed anesthesia with the patient or authorized representative who has indicated his/her understanding and acceptance.   Dental Advisory Given  Plan Discussed with:  CRNA  Anesthesia Plan Comments:         Anesthesia Quick Evaluation

## 2018-04-14 NOTE — Brief Op Note (Signed)
04/14/2018  3:58 PM  PATIENT:  Lisa Peters  37 y.o. female  PRE-OPERATIVE DIAGNOSIS:  abnormal uterine bleeding,adenomyosis chronic pelvic pain hydrosalpinx  POST-OPERATIVE DIAGNOSIS:  abnormal uterine bleeding,adenomyosis chronic pelvic pain   PROCEDURE:  Procedure(s): HYSTERECTOMY TOTAL LAPAROSCOPIC BILATERAL SALPINGECTOMY (N/A) CYSTOSCOPY (N/A)  SURGEON:  Surgeon(s) and Role:    Malachy Mood, MD - Primary    * Will Bonnet, MD - Assisting  PHYSICIAN ASSISTANT: Prentice Docker, MD  ASSISTANTS: none   ANESTHESIA:   general  EBL:  150 mL   BLOOD ADMINISTERED:none  DRAINS: Urinary Catheter (Foley)   LOCAL MEDICATIONS USED:  MARCAINE     SPECIMEN:  Uterus, cervix, and fallopian tubes  DISPOSITION OF SPECIMEN:  PATHOLOGY  COUNTS:  YES  TOURNIQUET:  * No tourniquets in log *  DICTATION: .Note written in EPIC  PLAN OF CARE: Admit for overnight observation  PATIENT DISPOSITION:  PACU - hemodynamically stable.   Delay start of Pharmacological VTE agent (>24hrs) due to surgical blood loss or risk of bleeding: yes

## 2018-04-14 NOTE — Transfer of Care (Signed)
Immediate Anesthesia Transfer of Care Note  Patient: Lisa Peters  Procedure(s) Performed: HYSTERECTOMY TOTAL LAPAROSCOPIC BILATERAL SALPINGECTOMY (N/A ) CYSTOSCOPY (N/A )  Patient Location: PACU  Anesthesia Type:General  Level of Consciousness: sedated  Airway & Oxygen Therapy: Patient Spontanous Breathing and Patient connected to face mask oxygen  Post-op Assessment: Report given to RN and Post -op Vital signs reviewed and stable  Post vital signs: Reviewed and stable  Last Vitals:  Vitals Value Taken Time  BP 121/77 04/14/2018  3:51 PM  Temp    Pulse 104 04/14/2018  3:52 PM  Resp 9 04/14/2018  3:52 PM  SpO2 99 % 04/14/2018  3:52 PM  Vitals shown include unvalidated device data.  Last Pain:  Vitals:   04/14/18 1125  TempSrc: Temporal  PainSc: 0-No pain         Complications: No apparent anesthesia complications

## 2018-04-14 NOTE — Anesthesia Postprocedure Evaluation (Signed)
Anesthesia Post Note  Patient: ABIE KILLIAN  Procedure(s) Performed: HYSTERECTOMY TOTAL LAPAROSCOPIC BILATERAL SALPINGECTOMY (N/A ) CYSTOSCOPY (N/A )  Patient location during evaluation: PACU Anesthesia Type: General Level of consciousness: awake and alert Pain management: pain level controlled Vital Signs Assessment: post-procedure vital signs reviewed and stable Respiratory status: spontaneous breathing, nonlabored ventilation, respiratory function stable and patient connected to nasal cannula oxygen Cardiovascular status: blood pressure returned to baseline and stable Postop Assessment: no apparent nausea or vomiting Anesthetic complications: no     Last Vitals:  Vitals:   04/14/18 1730 04/14/18 1847  BP: 115/83 117/65  Pulse: 93 78  Resp: 16 18  Temp: 36.8 C 36.7 C  SpO2: 93% 97%    Last Pain:  Vitals:   04/14/18 1847  TempSrc: Oral  PainSc:                  Quintin Hjort S

## 2018-04-14 NOTE — Anesthesia Post-op Follow-up Note (Signed)
Anesthesia QCDR form completed.        

## 2018-04-14 NOTE — Anesthesia Procedure Notes (Signed)
Procedure Name: Intubation Date/Time: 04/14/2018 1:15 PM Performed by: , , CRNA Pre-anesthesia Checklist: Patient identified, Emergency Drugs available, Suction available and Patient being monitored Patient Re-evaluated:Patient Re-evaluated prior to induction Oxygen Delivery Method: Circle system utilized Preoxygenation: Pre-oxygenation with 100% oxygen Induction Type: IV induction Ventilation: Mask ventilation without difficulty Laryngoscope Size: Miller and 2 Grade View: Grade I Tube type: Oral Tube size: 7.0 mm Number of attempts: 1 Airway Equipment and Method: LTA kit utilized and Stylet Placement Confirmation: ETT inserted through vocal cords under direct vision,  positive ETCO2 and breath sounds checked- equal and bilateral Secured at: 21 cm Tube secured with: Tape Dental Injury: Teeth and Oropharynx as per pre-operative assessment        

## 2018-04-14 NOTE — H&P (Signed)
Date of Initial H&P: 04/14/2018  History reviewed, patient examined, no change in status, stable for surgery.

## 2018-04-15 ENCOUNTER — Encounter: Payer: Self-pay | Admitting: Obstetrics and Gynecology

## 2018-04-15 DIAGNOSIS — N800 Endometriosis of uterus: Secondary | ICD-10-CM | POA: Diagnosis not present

## 2018-04-15 DIAGNOSIS — F329 Major depressive disorder, single episode, unspecified: Secondary | ICD-10-CM | POA: Diagnosis not present

## 2018-04-15 DIAGNOSIS — N946 Dysmenorrhea, unspecified: Secondary | ICD-10-CM | POA: Diagnosis not present

## 2018-04-15 DIAGNOSIS — N838 Other noninflammatory disorders of ovary, fallopian tube and broad ligament: Secondary | ICD-10-CM | POA: Diagnosis not present

## 2018-04-15 DIAGNOSIS — Z79899 Other long term (current) drug therapy: Secondary | ICD-10-CM | POA: Diagnosis not present

## 2018-04-15 DIAGNOSIS — Z791 Long term (current) use of non-steroidal anti-inflammatories (NSAID): Secondary | ICD-10-CM | POA: Diagnosis not present

## 2018-04-15 DIAGNOSIS — F419 Anxiety disorder, unspecified: Secondary | ICD-10-CM | POA: Diagnosis not present

## 2018-04-15 DIAGNOSIS — N939 Abnormal uterine and vaginal bleeding, unspecified: Secondary | ICD-10-CM | POA: Diagnosis not present

## 2018-04-15 DIAGNOSIS — K219 Gastro-esophageal reflux disease without esophagitis: Secondary | ICD-10-CM | POA: Diagnosis not present

## 2018-04-15 DIAGNOSIS — Z793 Long term (current) use of hormonal contraceptives: Secondary | ICD-10-CM | POA: Diagnosis not present

## 2018-04-15 LAB — BASIC METABOLIC PANEL
Anion gap: 4 — ABNORMAL LOW (ref 5–15)
BUN: 8 mg/dL (ref 6–20)
CO2: 25 mmol/L (ref 22–32)
Calcium: 8 mg/dL — ABNORMAL LOW (ref 8.9–10.3)
Chloride: 111 mmol/L (ref 98–111)
Creatinine, Ser: 0.68 mg/dL (ref 0.44–1.00)
GFR calc Af Amer: >60 mL/min (ref >60)
GFR calc non Af Amer: >60 mL/min (ref >60)
Glucose, Bld: 137 mg/dL — ABNORMAL HIGH (ref 70–99)
Potassium: 3.8 mmol/L (ref 3.5–5.1)
SODIUM: 140 mmol/L (ref 135–145)

## 2018-04-15 LAB — CBC
HCT: 31.4 % — ABNORMAL LOW (ref 36.0–46.0)
Hemoglobin: 10.3 g/dL — ABNORMAL LOW (ref 12.0–15.0)
MCH: 33.7 pg (ref 26.0–34.0)
MCHC: 32.8 g/dL (ref 30.0–36.0)
MCV: 102.6 fL — ABNORMAL HIGH (ref 80.0–100.0)
NRBC: 0 % (ref 0.0–0.2)
Platelets: 361 10*3/uL (ref 150–400)
RBC: 3.06 MIL/uL — ABNORMAL LOW (ref 3.87–5.11)
RDW: 13 % (ref 11.5–15.5)
WBC: 13.5 10*3/uL — ABNORMAL HIGH (ref 4.0–10.5)

## 2018-04-15 MED ORDER — IBUPROFEN 200 MG PO TABS: 800.0000 mg | ORAL_TABLET | Freq: Every day | ORAL | 0 refills | Status: DC

## 2018-04-15 MED ORDER — IBUPROFEN 600 MG PO TABS: 600.0000 mg | ORAL_TABLET | Freq: Four times a day (QID) | ORAL | 3 refills | Status: DC | PRN

## 2018-04-15 MED ORDER — OXYCODONE-ACETAMINOPHEN 5-325 MG PO TABS: 1.0000 | ORAL_TABLET | ORAL | 0 refills | Status: DC | PRN

## 2018-04-15 NOTE — Op Note (Signed)
Preoperative Diagnosis: 1) 37 y.o.  Abnormal uterine bleeding 2) Dysmenorrhea  Postoperative Diagnosis: 1) 37 y.o. Abnormal uterine bleeding 2) Dysmenorrhea  Operation Performed: Total laparoscopic hysterectomy, bilateral salpingectomy, and cystoscopy  Indication: Abnormal uterine bleeding, dysmenorrhea  Surgeon: Malachy Mood, MD  Assistant: Prentice Docker, MD  Procedure requiring high level surgical assistant with none readily available  Anesthesia: General  Preoperative Antibiotics: 2g ancef  Estimated Blood Loss: 150 mL  IV Fluids: 1886mL  Drains or Tubes: none  Implants: none  Specimens Removed: Uterus, cervix, and bilateral fallopian tubes  Complications: none  Intraoperative Findings: Normal tubes, ovaries, and uterus  Patient Condition: stable  Procedure in Detail:  Patient was taken to the operating room where she was administered general anesthesia.  She was positioned in the dorsal lithotomy position utilizing Allen stirups, prepped and draped in the usual sterile fashion.  Prior to proceeding with procedure a time out was performed.  Attention was turned to the patient's pelvis.  An indwelling foley catheter was placed to decompress the patient's bladder.  An operative speculum was placed to allow visualization of the cervix.  The anterior lip of the cervix was grasped with a single tooth tenaculum, and a large V-care uterine manipulator was placed to allow manipulation of the uterus.  The operative speculum and single tooth tenaculum were then removed.  Attention was turned to the patient's abdomen.  The umbilicus was infiltrated with 1% Sensorcaine, before making a stab incision using an 11 blade scalpel.  A 77mm Excel trocar was then used to gain direct entry into the peritoneal cavity utilizing the camera to visualize progress of the trocar during placement.  Once peritoneal entry had been achieved, insufflation was started and pneumoperitoneum established at  a pressure of 27mmHg.    One left and one right lower quadrant site were then injected with 1% Sensorcaine and a stab incision was made using an 11 blade scalpel.  Two additional 65mm Excel trocars were placed through these incisions under direct visualization. General inspection of the abdomen revealed the above noted findings.   The right tube was identified and grasped at its fimbriated end.  The tube was transected from its attachments to the ovary and mesosalpinx using a 62mm Harmonic scalpel.  The utero ovarian ligament was identified ligated and transected using the Harmonic scalpel. The round ligament was then likewise ligated and transected.  The anterior leaf of the broad ligament was dissected down to the level of the internal cervical os and a bladder flap was started.  The posterior leaf of the broad ligament was dissected down to the utero-sacral ligament.  The uterine artery was skeletonized before being ligated and transected using the Harmonic scalpel with cephelad pressure applied to the V-care device to assure lateralization of the ureter.  A bite was then taken with Harmonic medial to transected portio of uterine artery to further lateralize the ureter and vessel off the V-care cup.  The patient left adnexal structures were then dissected in similar fashion.  The bladder flap was completed and the bladder mobilized off the V-care cup.  An anterior colpotomy was scores and carried around in a clockwise fashion to free the specimen, which was then removed vaginally.  Inspection revealed all pedicles to be hemostatic before proceed with vaginal closure.  Attention was turned to the patient pelvis.  An operative speculum was placed and the anterior and posterior portion of the vaginal cuff were tagged with long Alice clamps.  The cuff was closed using interrupted  figure of eight stitches using 0 Vicryl stitches.  The cuff was hemsotatic at the conclusion of closure without visible or palpable  defects.  The indwelling foley catheter was removed.  Cystoscopy was performed noting and intact bladder dome as well as brisk efflux of urine from bother ureteral orifices.  The cystoscopy was removed and the indwelling foley catheter was replaced.  Pneumoperitoneum was re-established, the pelvis was irrigated and all pedicles were once again inspected and noted to be hemostatic.  The cuff appeared to have a small 0.5cm defect and the left lateral aspect which was closed with a single stitch of 0 Vicryl vaginally.  Additional hemostatic agents were  applied to all pedicles (1g of Arista).     Pneumoperitoneum was evacuated and trocars were removed.  All trocar sites were then dressed with surgical skin glue.  Sponge needle and instrument counts were correct time two.  The patient tolerated the procedure well and was taken to the recovery room in stable condition.

## 2018-04-15 NOTE — Discharge Summary (Signed)
Physician Discharge Summary  Patient ID: Lisa Peters MRN: [id number] DOB/AGE: [birth year] 38 y.o.  Admit date: 04/14/2018 Discharge date: 04/15/2018  Admission Diagnoses: Scheduled TLH, BS, cystoscopy  Discharge Diagnoses:  Active Problems:   S/P laparoscopic hysterectomy   Discharged Condition: good  Hospital Course: 37 y.o. year old with chronic dysmenorrhea and AUB.  The patient underwent scheduled TLH, BS, cystoscopy which proceeded without complications.  The patient did well postoperatively and remained afebrile and hemodynamically stable throughout the admission.  At the time of discharge the patient was tolerating po, voided, was ambulating, and reported good pain control on po analgesics.  Postoperative labs revealed appropriate H&H and stable BUN/Cr  Consults: None  Significant Diagnostic Studies:  Results for orders placed or performed during the hospital encounter of 04/14/18 (from the past 24 hour(s))  ABO/Rh     Status: None   Collection Time: 04/14/18 11:05 AM  Result Value Ref Range   ABO/RH(D)      A POS Performed at [facility], [address] [place], [place] [address]   Pregnancy, urine POC     Status: None   Collection Time: 04/14/18 11:32 AM  Result Value Ref Range   Preg Test, Ur NEGATIVE NEGATIVE  CBC     Status: Abnormal   Collection Time: 04/15/18  4:58 AM  Result Value Ref Range   WBC 13.5 (H) 4.0 - 10.5 K/uL   RBC 3.06 (L) 3.87 - 5.11 MIL/uL   Hemoglobin 10.3 (L) 12.0 - 15.0 g/dL   HCT 31.4 (L) 36.0 - 46.0 %   MCV 102.6 (H) 80.0 - 100.0 fL   MCH 33.7 26.0 - 34.0 pg   MCHC 32.8 30.0 - 36.0 g/dL   RDW 13.0 11.5 - 15.5 %   Platelets 361 150 - 400 K/uL   nRBC 0.0 0.0 - 0.2 %  Basic metabolic panel     Status: Abnormal   Collection Time: 04/15/18  4:58 AM  Result Value Ref Range   Sodium 140 135 - 145 mmol/L   Potassium 3.8 3.5 - 5.1 mmol/L   Chloride 111 98 - 111 mmol/L   CO2 25 22 - 32 mmol/L   Glucose, Bld 137 (H) 70 - 99  mg/dL   BUN 8 6 - 20 mg/dL   Creatinine, Ser 0.68 0.44 - 1.00 mg/dL   Calcium 8.0 (L) 8.9 - 10.3 mg/dL   GFR calc non Af Amer >60 >60 mL/min   GFR calc Af Amer >60 >60 mL/min   Anion gap 4 (L) 5 - 15    Treatments: Surgery TLH, BS, cystoscopy  Discharge Exam: Blood pressure 101/65, pulse 96, temperature 98.1 F (36.7 C), temperature source Oral, resp. rate 18, height 5\' 4"  (1.626 m), weight 66.2 kg, SpO2 97 %. General appearance: alert, appears stated age and no distress Resp: clear to auscultation bilaterally Cardio: regular rate and rhythm, S1, S2 normal, no murmur, click, rub or gallop GI: soft, non-tender; bowel sounds normal; no masses,  no organomegaly Extremities: extremities normal, atraumatic, no cyanosis or edema Incisions D/C/I  Disposition:    Allergies as of 04/15/2018   No Known Allergies     Medication List    STOP taking these medications   medroxyPROGESTERone 150 MG/ML injection Commonly known as:  DEPO-PROVERA     TAKE these medications   ALPRAZolam 0.5 MG tablet Commonly known as:  XANAX Take 0.5 mg by mouth 2 (two) times daily.   HUMIRA 40 MG/0.8ML Pskt Generic drug:  Adalimumab  Inject 40 mg into the skin every 14 (fourteen) days.   ibuprofen 600 MG tablet Commonly known as:  ADVIL,MOTRIN Take 1 tablet (600 mg total) by mouth every 6 (six) hours as needed for mild pain or cramping. What changed:  You were already taking a medication with the same name, and this prescription was added. Make sure you understand how and when to take each.   ibuprofen 200 MG tablet Commonly known as:  ADVIL,MOTRIN Take 4 tablets (800 mg total) by mouth daily. What changed:  Another medication with the same name was added. Make sure you understand how and when to take each.   mirtazapine 30 MG tablet Commonly known as:  REMERON Take 30 mg by mouth at bedtime.   oxyCODONE-acetaminophen 5-325 MG tablet Commonly known as:  PERCOCET/ROXICET Take 1 tablet by mouth  every 4 (four) hours as needed for moderate pain or severe pain.   pantoprazole 40 MG tablet Commonly known as:  PROTONIX Take 1 tablet (40 mg total) by mouth daily. What changed:  when to take this   REXULTI 1 MG Tabs Generic drug:  Brexpiprazole Take 1 mg by mouth daily.   VIIBRYD 40 MG Tabs Generic drug:  Vilazodone HCl Take 40 mg by mouth daily.   Vitamin D 50 MCG (2000 UT) Caps Take 2,000 Units by mouth daily.      Follow-up Information    Malachy Mood, MD Follow up in 1 week(s).   Specialty:  Obstetrics and Gynecology Contact information: [address] [place] [address] [phone]           Signed: Malachy Mood 04/15/2018, 7:59 AM

## 2018-04-15 NOTE — Progress Notes (Signed)
Discharge instructions provided.  Pt and sig other verbalize understanding of all instructions and follow-up care.  Pt discharged to home at 1128 on 04/15/18 via wheelchair by volunteer. Reed Breech, RN 04/15/2018 12:00 PM

## 2018-04-15 NOTE — Discharge Instructions (Signed)
Call your doctor for increased pain or vaginal bleeding, temperature above 100.4, depression, or concerns.  Keep incisions clean and dry.  Call your doctor for incision concerns including redness, swelling, bleeding or drainage, or if begins to come apart.  No strenuous activity or heavy lifting for 6 weeks.  No intercourse, tampons, or douching for 6 weeks.  No tub baths- showers only.  No driving for 2 weeks or while taking Percocet.

## 2018-04-18 ENCOUNTER — Other Ambulatory Visit: Payer: Self-pay | Admitting: Obstetrics and Gynecology

## 2018-04-18 LAB — SURGICAL PATHOLOGY

## 2018-04-19 DIAGNOSIS — F3181 Bipolar II disorder: Secondary | ICD-10-CM | POA: Diagnosis not present

## 2018-04-19 DIAGNOSIS — F331 Major depressive disorder, recurrent, moderate: Secondary | ICD-10-CM | POA: Diagnosis not present

## 2018-04-19 DIAGNOSIS — F332 Major depressive disorder, recurrent severe without psychotic features: Secondary | ICD-10-CM | POA: Diagnosis not present

## 2018-04-19 DIAGNOSIS — F4310 Post-traumatic stress disorder, unspecified: Secondary | ICD-10-CM | POA: Diagnosis not present

## 2018-04-19 DIAGNOSIS — F411 Generalized anxiety disorder: Secondary | ICD-10-CM | POA: Diagnosis not present

## 2018-04-19 DIAGNOSIS — F4312 Post-traumatic stress disorder, chronic: Secondary | ICD-10-CM | POA: Diagnosis not present

## 2018-04-21 NOTE — Progress Notes (Signed)
Postoperative Follow-up Patient presents post op from [place], BS, and cystoscopy 1weeks ago for abnormal uterine bleeding.  Subjective: Patient reports marked improvement in her preop symptoms. Eating a regular diet without difficulty. Pain is controlled without any medications.  Activity: normal activities of daily living.    Objective: Vitals Blood pressure 124/64, pulse 86, height 5\' 4"  (1.626 m), weight 151 lb (68.5 kg).   General: NAD Pulmonary: no increased work of breathing Abdomen: soft, non-tender, non-distended, incision(s) D/C/I Extremities: no edema Neurologic: normal gait    Admission on 04/14/2018, Discharged on 04/15/2018  Component Date Value Ref Range Status  . ABO/RH(D) 04/14/2018    Final                   Value:A POS Performed at [facility], [address], [place],  [address]   . Preg Test, Ur 04/14/2018 NEGATIVE  NEGATIVE Final   Comment:        THE SENSITIVITY OF THIS METHODOLOGY IS >24 mIU/mL   . SURGICAL PATHOLOGY 04/14/2018    Final                   Value:Surgical Pathology CASE: [id number] PATIENT: Einar Pheasant Surgical Pathology Report     SPECIMEN SUBMITTED: A. Uterus with cervix, bilateral tubes  CLINICAL HISTORY: None provided  PRE-OPERATIVE DIAGNOSIS: Abnormal uterine bleeding, adenomyosis chronic pelvic pain hydrosalpinx  POST-OPERATIVE DIAGNOSIS: Abnormal uterine bleeding, adenomyosis chronic pelvic pain     DIAGNOSIS: A. UTERUS WITH CERVIX, BILATERAL FALLOPIAN TUBES; HYSTERECTOMY AND BILATERAL SALPINGECTOMY: - BENIGN ECTOCERVICAL AND ENDOCERVICAL MUCOSA. - BENIGN WEAKLY PROLIFERATIVE ENDOMETRIUM AND ADENOMYOSIS. - BILATERAL BENIGN FALLOPIAN TUBES. - BENIGN RIGHT PARATUBAL CYST.   GROSS DESCRIPTION: A. Labeled: Uterus with cervix, bilateral tubes Received: In formalin Weight: 83 grams Dimensions:      Fundus -5.1 x 4.3 x 3.6 cm      Cervix -3.6 x 3.5 cm with an external os of 0.6  cm Serosa: Purple to tan wrinkled Cervix: Smooth pink-tan Endocervix: Trabecular pink-tan Endometrial c                         avity:      Dimensions -2.9 x 1.9 cm      Thickness -0.1 cm      Other findings -none noted Myometrium:     Thickness -1.6 cm     Other findings -none noted Adnexa: Right fallopian tube           Measurements -6.1 cm in length x 1.6 cm in diameter           Other findings -purple fimbriated with a 0.5 cm paratubal cyst       Left fallopian tube            Measurements -5.3 cm in length x 1.6 cm in diameter           Other findings -purple fimbriated Other comments: None noted  Block summary: 1 - representative anterior cervix 2 - representative posterior cervix 3 - representative anterior endomyometrium 4 - representative posterior endomyometrium 5 - representative cross-section with paratubal cyst and longitudinal fimbriated and right fallopian tube 6 - representative cross-section and longitudinal fimbriated end left fallopian tube   Final Diagnosis performed by Raynelle Bring, MD.   Electronically signed 04/18/2018 9:12:04AM The electronic signature indic  ates that the named Attending Pathologist has evaluated the specimen  Technical component performed at [facility], [address] [place], [place] [address] Lab: [phone] Dir: Rush Farmer, MD, MMM  Professional component performed at [facility], [place], [place], [place] [address] Lab: [phone] Dir: Dellia Nims. Rubinas, MD   . WBC 04/15/2018 13.5* 4.0 - 10.5 K/uL Final  . RBC 04/15/2018 3.06* 3.87 - 5.11 MIL/uL Final  . Hemoglobin 04/15/2018 10.3* 12.0 - 15.0 g/dL Final  . HCT 04/15/2018 31.4* 36.0 - 46.0 % Final  . MCV 04/15/2018 102.6* 80.0 - 100.0 fL Final  . MCH 04/15/2018 33.7  26.0 - 34.0 pg Final  . MCHC 04/15/2018 32.8  30.0 - 36.0 g/dL Final  . RDW 04/15/2018 13.0  11.5 - 15.5 % Final  . Platelets 04/15/2018 361  150  - 400 K/uL Final  . nRBC 04/15/2018 0.0  0.0 - 0.2 % Final   Performed at [facility], [address], [place], [place] [address]  . Sodium 04/15/2018 140  135 - 145 mmol/L Final  . Potassium 04/15/2018 3.8  3.5 - 5.1 mmol/L Final  . Chloride 04/15/2018 111  98 - 111 mmol/L Final  . CO2 04/15/2018 25  22 - 32 mmol/L Final  . Glucose, Bld 04/15/2018 137* 70 - 99 mg/dL Final  . BUN 04/15/2018 8  6 - 20 mg/dL Final  . Creatinine, Ser 04/15/2018 0.68  0.44 - 1.00 mg/dL Final  . Calcium 04/15/2018 8.0* 8.9 - 10.3 mg/dL Final  . GFR calc non Af Amer 04/15/2018 >60  >60 mL/min Final  . GFR calc Af Amer 04/15/2018 >60  >60 mL/min Final  . Anion gap 04/15/2018 4* 5 - 15 Final   Performed at [facility], [address] [place], [place] [address]    Assessment: 37 y.o. s/p TLH, BS, TVT, cystoscopy stable  Plan: Patient has done well after surgery with no apparent complications.  I have discussed the post-operative course to date, and the expected progress moving forward.  The patient understands what complications to be concerned about.  I will see the patient in routine follow up, or sooner if needed.    Activity plan: No heavy lifting. No intercourse   Malachy Mood, MD, [place], [facility] 04/21/2018, 8:49 PM

## 2018-04-22 ENCOUNTER — Ambulatory Visit (INDEPENDENT_AMBULATORY_CARE_PROVIDER_SITE_OTHER): Payer: BLUE CROSS/BLUE SHIELD | Admitting: Obstetrics and Gynecology

## 2018-04-22 ENCOUNTER — Encounter: Payer: Self-pay | Admitting: Obstetrics and Gynecology

## 2018-04-22 VITALS — BP 124/64 | HR 86 | Ht 64.0 in | Wt 151.0 lb

## 2018-04-22 DIAGNOSIS — Z4889 Encounter for other specified surgical aftercare: Secondary | ICD-10-CM

## 2018-04-25 ENCOUNTER — Telehealth: Payer: Self-pay

## 2018-04-25 NOTE — Telephone Encounter (Signed)
Abbey from [facility] calling to confirm pt had surgery on 12/5.  [phone]  Information given to [facility] - surgery confirmed, last office visit, and next office visit.

## 2018-04-28 ENCOUNTER — Ambulatory Visit: Payer: BLUE CROSS/BLUE SHIELD | Admitting: Obstetrics and Gynecology

## 2018-05-02 DIAGNOSIS — F331 Major depressive disorder, recurrent, moderate: Secondary | ICD-10-CM | POA: Diagnosis not present

## 2018-05-02 DIAGNOSIS — F411 Generalized anxiety disorder: Secondary | ICD-10-CM | POA: Diagnosis not present

## 2018-05-02 DIAGNOSIS — F4310 Post-traumatic stress disorder, unspecified: Secondary | ICD-10-CM | POA: Diagnosis not present

## 2018-05-03 DIAGNOSIS — S52122A Displaced fracture of head of left radius, initial encounter for closed fracture: Secondary | ICD-10-CM | POA: Diagnosis not present

## 2018-05-03 DIAGNOSIS — W19XXXA Unspecified fall, initial encounter: Secondary | ICD-10-CM | POA: Diagnosis not present

## 2018-05-03 DIAGNOSIS — S52125A Nondisplaced fracture of head of left radius, initial encounter for closed fracture: Secondary | ICD-10-CM | POA: Diagnosis not present

## 2018-05-03 DIAGNOSIS — M25522 Pain in left elbow: Secondary | ICD-10-CM | POA: Diagnosis not present

## 2018-05-09 DIAGNOSIS — S52125A Nondisplaced fracture of head of left radius, initial encounter for closed fracture: Secondary | ICD-10-CM | POA: Diagnosis not present

## 2018-05-16 DIAGNOSIS — F331 Major depressive disorder, recurrent, moderate: Secondary | ICD-10-CM | POA: Diagnosis not present

## 2018-05-16 DIAGNOSIS — F4310 Post-traumatic stress disorder, unspecified: Secondary | ICD-10-CM | POA: Diagnosis not present

## 2018-05-16 DIAGNOSIS — F411 Generalized anxiety disorder: Secondary | ICD-10-CM | POA: Diagnosis not present

## 2018-05-23 DIAGNOSIS — S52125D Nondisplaced fracture of head of left radius, subsequent encounter for closed fracture with routine healing: Secondary | ICD-10-CM | POA: Diagnosis not present

## 2018-05-26 ENCOUNTER — Encounter: Payer: Self-pay | Admitting: Obstetrics and Gynecology

## 2018-05-26 ENCOUNTER — Ambulatory Visit (INDEPENDENT_AMBULATORY_CARE_PROVIDER_SITE_OTHER): Payer: BLUE CROSS/BLUE SHIELD | Admitting: Obstetrics and Gynecology

## 2018-05-26 VITALS — BP 119/82 | HR 84 | Wt 150.0 lb

## 2018-05-26 DIAGNOSIS — Z4889 Encounter for other specified surgical aftercare: Secondary | ICD-10-CM

## 2018-05-28 NOTE — Progress Notes (Signed)
Postoperative Follow-up Patient presents post op from [place], BS, cystosocpy 6weeks ago for abnormal uterine bleeding.  Subjective: Patient reports marked improvement in her preop symptoms. Eating a regular diet without difficulty. The patient is not having any pain.  Activity: normal activities of daily living.  Objective: Blood pressure 119/82, pulse 84, weight 150 lb (68 kg).  General: NAD Pulmonary: no increased work of breathing Abdomen: soft, non-tender, non-distended, incision(s) D/C/I GU: normal external female genitalia vaginal cuff intact, well healed Extremities: no edema Neurologic: normal gait    Admission on 04/14/2018, Discharged on 04/15/2018  Component Date Value Ref Range Status  . ABO/RH(D) 04/14/2018    Final                   Value:A POS Performed at [facility], [address], [place], [place] [address]   . Preg Test, Ur 04/14/2018 NEGATIVE  NEGATIVE Final   Comment:        THE SENSITIVITY OF THIS METHODOLOGY IS >24 mIU/mL   . SURGICAL PATHOLOGY 04/14/2018    Final                   Value:Surgical Pathology CASE: [id number] PATIENT: Lisa Peters Surgical Pathology Report     SPECIMEN SUBMITTED: A. Uterus with cervix, bilateral tubes  CLINICAL HISTORY: None provided  PRE-OPERATIVE DIAGNOSIS: Abnormal uterine bleeding, adenomyosis chronic pelvic pain hydrosalpinx  POST-OPERATIVE DIAGNOSIS: Abnormal uterine bleeding, adenomyosis chronic pelvic pain     DIAGNOSIS: A. UTERUS WITH CERVIX, BILATERAL FALLOPIAN TUBES; HYSTERECTOMY AND BILATERAL SALPINGECTOMY: - BENIGN ECTOCERVICAL AND ENDOCERVICAL MUCOSA. - BENIGN WEAKLY PROLIFERATIVE ENDOMETRIUM AND ADENOMYOSIS. - BILATERAL BENIGN FALLOPIAN TUBES. - BENIGN RIGHT PARATUBAL CYST.   GROSS DESCRIPTION: A. Labeled: Uterus with cervix, bilateral tubes Received: In formalin Weight: 83 grams Dimensions:      Fundus -5.1 x 4.3 x 3.6 cm      Cervix -3.6 x 3.5 cm with an  external os of 0.6 cm Serosa: Purple to tan wrinkled Cervix: Smooth pink-tan Endocervix: Trabecular pink-tan Endometrial c                         avity:      Dimensions -2.9 x 1.9 cm      Thickness -0.1 cm      Other findings -none noted Myometrium:     Thickness -1.6 cm     Other findings -none noted Adnexa: Right fallopian tube           Measurements -6.1 cm in length x 1.6 cm in diameter           Other findings -purple fimbriated with a 0.5 cm paratubal cyst       Left fallopian tube            Measurements -5.3 cm in length x 1.6 cm in diameter           Other findings -purple fimbriated Other comments: None noted  Block summary: 1 - representative anterior cervix 2 - representative posterior cervix 3 - representative anterior endomyometrium 4 - representative posterior endomyometrium 5 - representative cross-section with paratubal cyst and longitudinal fimbriated and right fallopian tube 6 - representative cross-section and longitudinal fimbriated end left fallopian tube   Final Diagnosis performed by Raynelle Bring, MD.   Electronically signed 04/18/2018 9:12:04AM The electronic signature indic  ates that the named Attending Pathologist has evaluated the specimen  Technical component performed at [facility], [address] [place], [place] [address] Lab: [phone] Dir: Rush Farmer, MD, MMM  Professional component performed at [facility], [facility], [place], [place], [place] [address] Lab: [phone] Dir: Dellia Nims. Rubinas, MD   . WBC 04/15/2018 13.5* 4.0 - 10.5 K/uL Final  . RBC 04/15/2018 3.06* 3.87 - 5.11 MIL/uL Final  . Hemoglobin 04/15/2018 10.3* 12.0 - 15.0 g/dL Final  . HCT 04/15/2018 31.4* 36.0 - 46.0 % Final  . MCV 04/15/2018 102.6* 80.0 - 100.0 fL Final  . MCH 04/15/2018 33.7  26.0 - 34.0 pg Final  . MCHC 04/15/2018 32.8  30.0 - 36.0 g/dL Final  . RDW 04/15/2018 13.0  11.5 - 15.5 % Final  . Platelets  04/15/2018 361  150 - 400 K/uL Final  . nRBC 04/15/2018 0.0  0.0 - 0.2 % Final   Performed at [facility], [address] [place], [place] [address]  . Sodium 04/15/2018 140  135 - 145 mmol/L Final  . Potassium 04/15/2018 3.8  3.5 - 5.1 mmol/L Final  . Chloride 04/15/2018 111  98 - 111 mmol/L Final  . CO2 04/15/2018 25  22 - 32 mmol/L Final  . Glucose, Bld 04/15/2018 137* 70 - 99 mg/dL Final  . BUN 04/15/2018 8  6 - 20 mg/dL Final  . Creatinine, Ser 04/15/2018 0.68  0.44 - 1.00 mg/dL Final  . Calcium 04/15/2018 8.0* 8.9 - 10.3 mg/dL Final  . GFR calc non Af Amer 04/15/2018 >60  >60 mL/min Final  . GFR calc Af Amer 04/15/2018 >60  >60 mL/min Final  . Anion gap 04/15/2018 4* 5 - 15 Final   Performed at [facility], [address], [place], [place]  [address]    Assessment: 38 y.o. s/p TLH, BS, cystoscopy stable  Plan: Patient has done well after surgery with no apparent complications.  I have discussed the post-operative course to date, and the expected progress moving forward.  The patient understands what complications to be concerned about.  I will see the patient in routine follow up, or sooner if needed.    Activity plan: No restriction.   Malachy Mood, MD, [facility], [place]

## 2018-05-30 DIAGNOSIS — F4310 Post-traumatic stress disorder, unspecified: Secondary | ICD-10-CM | POA: Diagnosis not present

## 2018-05-30 DIAGNOSIS — F331 Major depressive disorder, recurrent, moderate: Secondary | ICD-10-CM | POA: Diagnosis not present

## 2018-05-30 DIAGNOSIS — F3181 Bipolar II disorder: Secondary | ICD-10-CM | POA: Diagnosis not present

## 2018-05-30 DIAGNOSIS — F332 Major depressive disorder, recurrent severe without psychotic features: Secondary | ICD-10-CM | POA: Diagnosis not present

## 2018-05-30 DIAGNOSIS — F4312 Post-traumatic stress disorder, chronic: Secondary | ICD-10-CM | POA: Diagnosis not present

## 2018-05-30 DIAGNOSIS — F411 Generalized anxiety disorder: Secondary | ICD-10-CM | POA: Diagnosis not present

## 2018-06-06 DIAGNOSIS — F331 Major depressive disorder, recurrent, moderate: Secondary | ICD-10-CM | POA: Diagnosis not present

## 2018-06-06 DIAGNOSIS — F4310 Post-traumatic stress disorder, unspecified: Secondary | ICD-10-CM | POA: Diagnosis not present

## 2018-06-06 DIAGNOSIS — F411 Generalized anxiety disorder: Secondary | ICD-10-CM | POA: Diagnosis not present

## 2018-06-10 DIAGNOSIS — F418 Other specified anxiety disorders: Secondary | ICD-10-CM | POA: Diagnosis not present

## 2018-06-10 DIAGNOSIS — K449 Diaphragmatic hernia without obstruction or gangrene: Secondary | ICD-10-CM | POA: Diagnosis not present

## 2018-06-10 DIAGNOSIS — K209 Esophagitis, unspecified: Secondary | ICD-10-CM | POA: Diagnosis not present

## 2018-06-10 DIAGNOSIS — R5383 Other fatigue: Secondary | ICD-10-CM | POA: Diagnosis not present

## 2018-06-10 DIAGNOSIS — Z13220 Encounter for screening for lipoid disorders: Secondary | ICD-10-CM | POA: Diagnosis not present

## 2018-06-10 DIAGNOSIS — R8290 Unspecified abnormal findings in urine: Secondary | ICD-10-CM | POA: Diagnosis not present

## 2018-06-10 DIAGNOSIS — R5381 Other malaise: Secondary | ICD-10-CM | POA: Diagnosis not present

## 2018-06-10 DIAGNOSIS — E538 Deficiency of other specified B group vitamins: Secondary | ICD-10-CM | POA: Diagnosis not present

## 2018-06-10 DIAGNOSIS — M1380 Other specified arthritis, unspecified site: Secondary | ICD-10-CM | POA: Diagnosis not present

## 2018-06-13 DIAGNOSIS — S52125D Nondisplaced fracture of head of left radius, subsequent encounter for closed fracture with routine healing: Secondary | ICD-10-CM | POA: Diagnosis not present

## 2018-06-20 DIAGNOSIS — F411 Generalized anxiety disorder: Secondary | ICD-10-CM | POA: Diagnosis not present

## 2018-06-20 DIAGNOSIS — F331 Major depressive disorder, recurrent, moderate: Secondary | ICD-10-CM | POA: Diagnosis not present

## 2018-06-20 DIAGNOSIS — F4310 Post-traumatic stress disorder, unspecified: Secondary | ICD-10-CM | POA: Diagnosis not present

## 2018-06-27 DIAGNOSIS — M1380 Other specified arthritis, unspecified site: Secondary | ICD-10-CM | POA: Diagnosis not present

## 2018-06-27 DIAGNOSIS — R41840 Attention and concentration deficit: Secondary | ICD-10-CM | POA: Diagnosis not present

## 2018-06-27 DIAGNOSIS — F3340 Major depressive disorder, recurrent, in remission, unspecified: Secondary | ICD-10-CM | POA: Diagnosis not present

## 2018-06-27 DIAGNOSIS — F411 Generalized anxiety disorder: Secondary | ICD-10-CM | POA: Diagnosis not present

## 2018-07-01 DIAGNOSIS — S52125A Nondisplaced fracture of head of left radius, initial encounter for closed fracture: Secondary | ICD-10-CM

## 2018-07-01 HISTORY — DX: Nondisplaced fracture of head of left radius, initial encounter for closed fracture: S52.125A

## 2018-07-06 ENCOUNTER — Other Ambulatory Visit: Payer: Self-pay | Admitting: Surgery

## 2018-07-06 DIAGNOSIS — S52125A Nondisplaced fracture of head of left radius, initial encounter for closed fracture: Secondary | ICD-10-CM

## 2018-07-07 ENCOUNTER — Ambulatory Visit
Admission: RE | Admit: 2018-07-07 | Discharge: 2018-07-07 | Disposition: A | Discharge status: Home / Self Care | Payer: BLUE CROSS/BLUE SHIELD | Source: Ambulatory Visit | Attending: Surgery | Admitting: Surgery

## 2018-07-07 DIAGNOSIS — S52125A Nondisplaced fracture of head of left radius, initial encounter for closed fracture: Secondary | ICD-10-CM | POA: Diagnosis not present

## 2018-07-11 DIAGNOSIS — S52125G Nondisplaced fracture of head of left radius, subsequent encounter for closed fracture with delayed healing: Secondary | ICD-10-CM | POA: Diagnosis not present

## 2018-07-25 DIAGNOSIS — F4310 Post-traumatic stress disorder, unspecified: Secondary | ICD-10-CM | POA: Diagnosis not present

## 2018-07-25 DIAGNOSIS — F331 Major depressive disorder, recurrent, moderate: Secondary | ICD-10-CM | POA: Diagnosis not present

## 2018-07-25 DIAGNOSIS — F411 Generalized anxiety disorder: Secondary | ICD-10-CM | POA: Diagnosis not present

## 2018-08-15 DIAGNOSIS — S52125G Nondisplaced fracture of head of left radius, subsequent encounter for closed fracture with delayed healing: Secondary | ICD-10-CM | POA: Diagnosis not present

## 2018-08-18 DIAGNOSIS — M1380 Other specified arthritis, unspecified site: Secondary | ICD-10-CM | POA: Diagnosis not present

## 2018-08-18 DIAGNOSIS — Z1589 Genetic susceptibility to other disease: Secondary | ICD-10-CM | POA: Diagnosis not present

## 2018-08-18 DIAGNOSIS — R200 Anesthesia of skin: Secondary | ICD-10-CM | POA: Diagnosis not present

## 2018-08-18 DIAGNOSIS — Z9181 History of falling: Secondary | ICD-10-CM | POA: Insufficient documentation

## 2018-08-18 DIAGNOSIS — M2560 Stiffness of unspecified joint, not elsewhere classified: Secondary | ICD-10-CM | POA: Diagnosis not present

## 2018-09-26 DIAGNOSIS — F331 Major depressive disorder, recurrent, moderate: Secondary | ICD-10-CM | POA: Diagnosis not present

## 2018-09-26 DIAGNOSIS — F4310 Post-traumatic stress disorder, unspecified: Secondary | ICD-10-CM | POA: Diagnosis not present

## 2018-09-26 DIAGNOSIS — F411 Generalized anxiety disorder: Secondary | ICD-10-CM | POA: Diagnosis not present

## 2018-12-06 DIAGNOSIS — F331 Major depressive disorder, recurrent, moderate: Secondary | ICD-10-CM | POA: Diagnosis not present

## 2018-12-06 DIAGNOSIS — F4310 Post-traumatic stress disorder, unspecified: Secondary | ICD-10-CM | POA: Diagnosis not present

## 2018-12-06 DIAGNOSIS — F411 Generalized anxiety disorder: Secondary | ICD-10-CM | POA: Diagnosis not present

## 2018-12-20 DIAGNOSIS — F411 Generalized anxiety disorder: Secondary | ICD-10-CM | POA: Diagnosis not present

## 2018-12-20 DIAGNOSIS — F4310 Post-traumatic stress disorder, unspecified: Secondary | ICD-10-CM | POA: Diagnosis not present

## 2018-12-20 DIAGNOSIS — F331 Major depressive disorder, recurrent, moderate: Secondary | ICD-10-CM | POA: Diagnosis not present

## 2018-12-26 DIAGNOSIS — F331 Major depressive disorder, recurrent, moderate: Secondary | ICD-10-CM | POA: Diagnosis not present

## 2018-12-26 DIAGNOSIS — F411 Generalized anxiety disorder: Secondary | ICD-10-CM | POA: Diagnosis not present

## 2018-12-26 DIAGNOSIS — F4310 Post-traumatic stress disorder, unspecified: Secondary | ICD-10-CM | POA: Diagnosis not present

## 2019-01-05 DIAGNOSIS — F411 Generalized anxiety disorder: Secondary | ICD-10-CM | POA: Diagnosis not present

## 2019-01-05 DIAGNOSIS — F4310 Post-traumatic stress disorder, unspecified: Secondary | ICD-10-CM | POA: Diagnosis not present

## 2019-01-05 DIAGNOSIS — F331 Major depressive disorder, recurrent, moderate: Secondary | ICD-10-CM | POA: Diagnosis not present

## 2019-03-22 IMAGING — CT CT ELBOW*L* W/O CM
1 series · 12 of 14 positions shown, 15 images · non-contrast
Comparison: None.

CLINICAL DATA: Status post fall, left elbow pain

EXAM:
CT OF THE UPPER LEFT EXTREMITY WITHOUT CONTRAST
TECHNIQUE: Multidetector CT imaging of the upper left extremity was performed
according to the standard protocol.

[Series 7: axial st · axial · 0.16mm/px · z∈[-199,-89]mm · 12 of 131 slices shown, 15 images]
[im 11/131  soft-tissue]
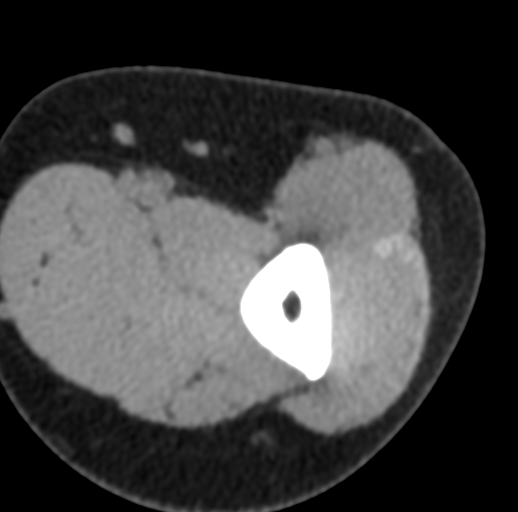
[im 11/131  bone]
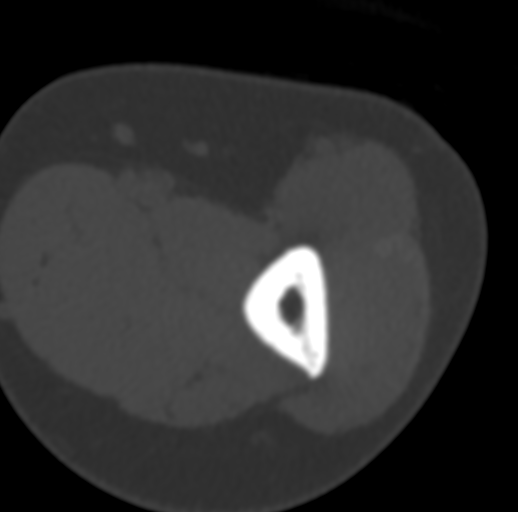
[im 21/131  bone]
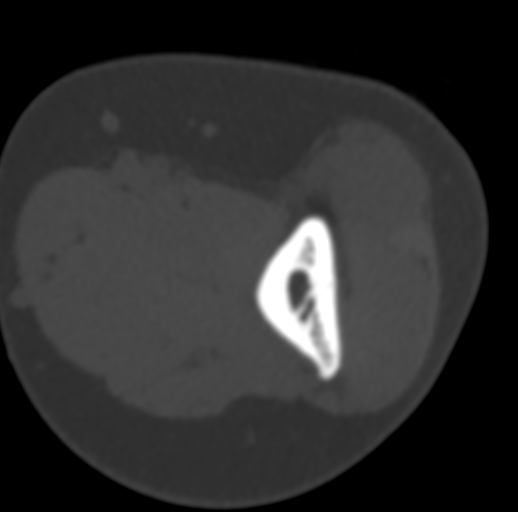
[im 31/131  bone]
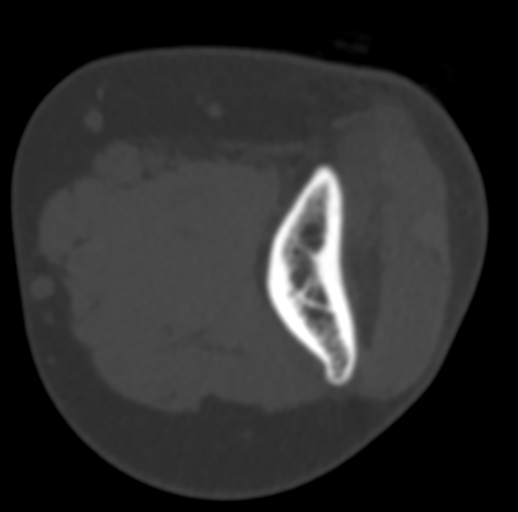
[im 41/131  bone]
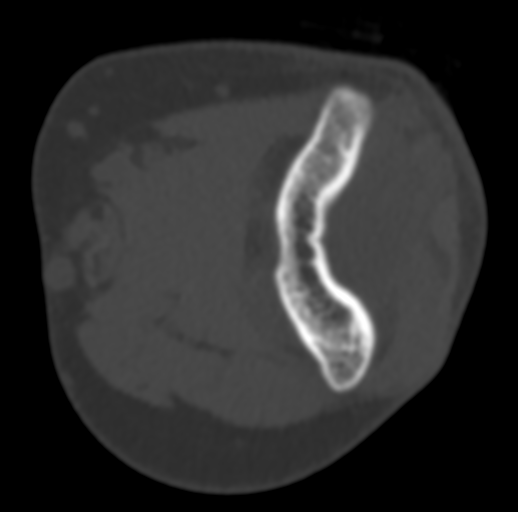
[im 51/131  soft-tissue]
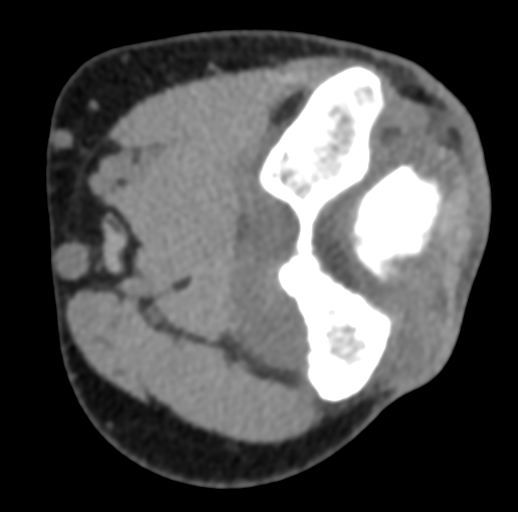
[im 51/131  bone]
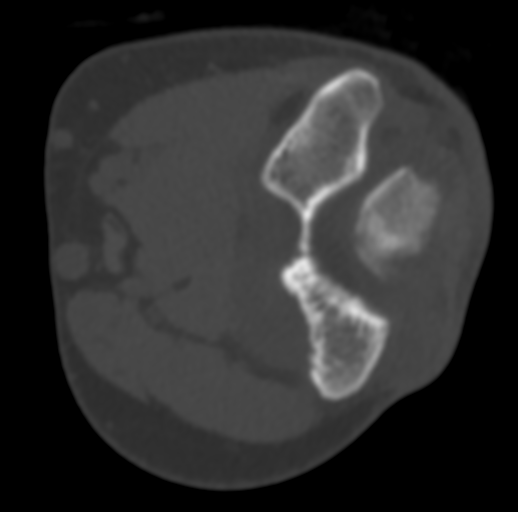
[im 61/131  bone]
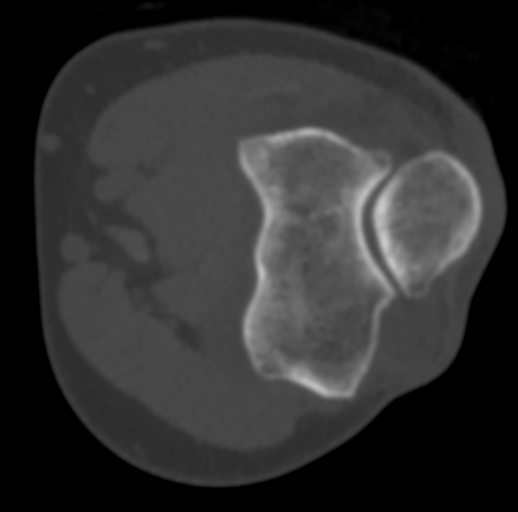
[im 71/131  bone]
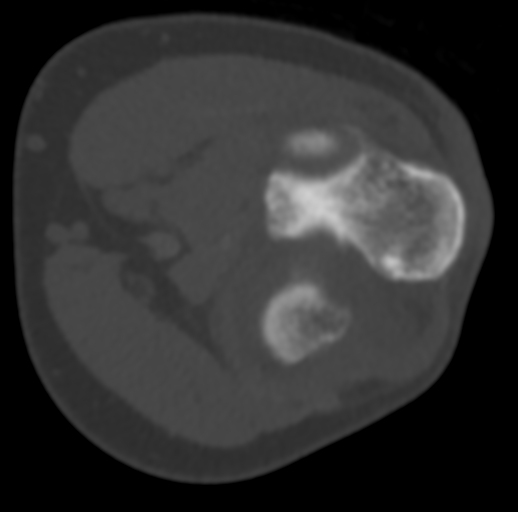
[im 81/131  bone]
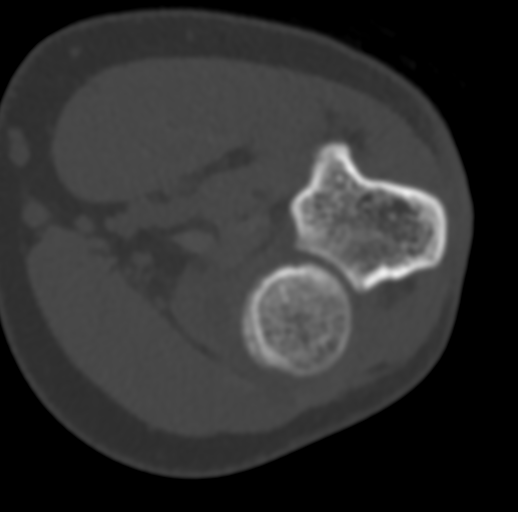
[im 91/131  soft-tissue]
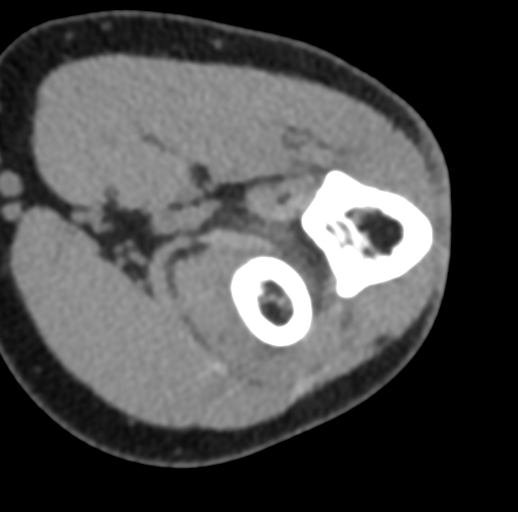
[im 91/131  bone]
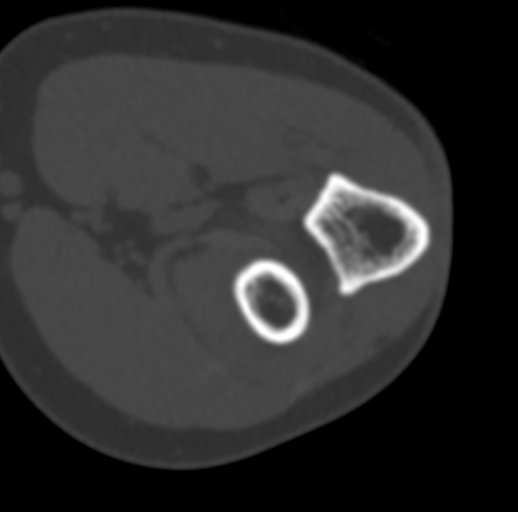
[im 101/131  bone]
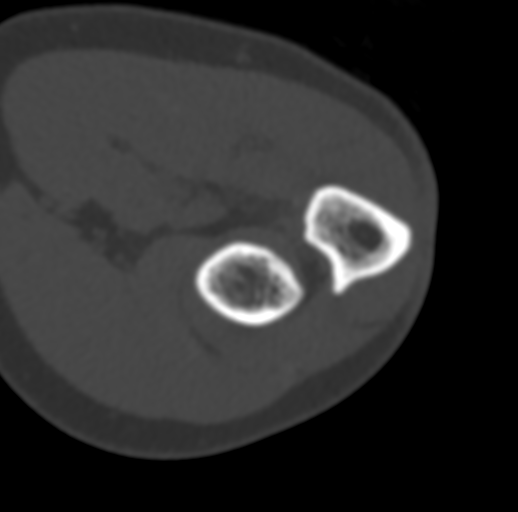
[im 111/131  bone]
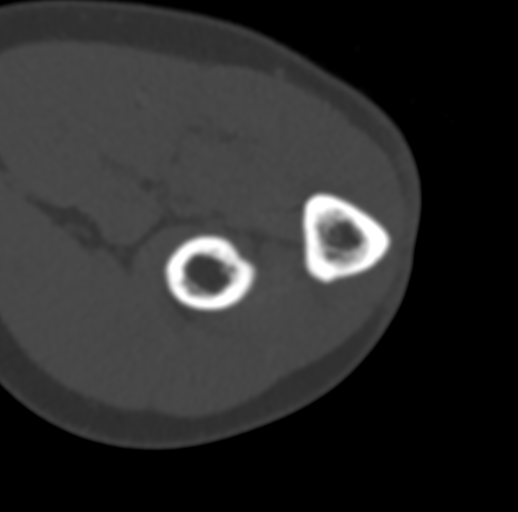
[im 121/131  bone]
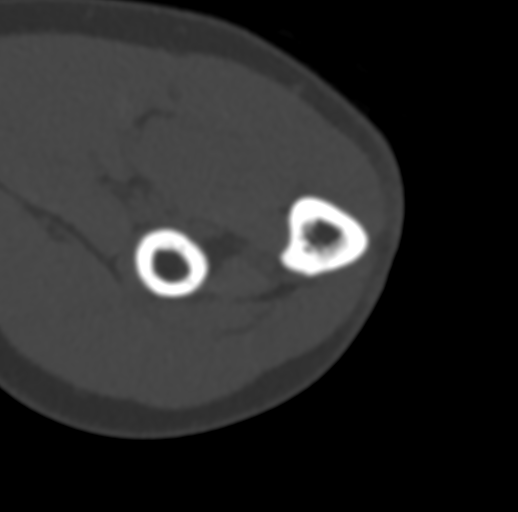

[12 of 14 positions shown; findings below may reference images not displayed]

FINDINGS: Bones/Joint/Cartilage

Nondisplaced fracture of the radial head with 1 mm of step-off at
the articular surface. No other acute fracture or dislocation. Mild
osteoarthritis of the radiocapitellar joint.

No other fracture or dislocation. Normal alignment. Large joint
effusion.

No periosteal reaction or bone destruction.

Ligaments

Ligaments are suboptimally evaluated by CT.

Muscles and Tendons
Muscles are normal.  No muscle atrophy.  Biceps tendon is intact.

Soft tissue
No fluid collection or hematoma.  No soft tissue mass.
IMPRESSION: 1. Nondisplaced fracture of the radial head with 1 mm of step-off at
the articular surface. Large joint effusion.

## 2019-10-25 ENCOUNTER — Emergency Department: Payer: BC Managed Care – PPO

## 2019-10-25 ENCOUNTER — Emergency Department
Admission: EM | Admit: 2019-10-25 | Discharge: 2019-10-25 | Disposition: A | Discharge status: Home / Self Care | Payer: BC Managed Care – PPO | Attending: Emergency Medicine | Admitting: Emergency Medicine

## 2019-10-25 ENCOUNTER — Other Ambulatory Visit: Payer: Self-pay

## 2019-10-25 DIAGNOSIS — L03116 Cellulitis of left lower limb: Secondary | ICD-10-CM | POA: Diagnosis not present

## 2019-10-25 DIAGNOSIS — M79672 Pain in left foot: Secondary | ICD-10-CM | POA: Diagnosis present

## 2019-10-25 DIAGNOSIS — M069 Rheumatoid arthritis, unspecified: Secondary | ICD-10-CM | POA: Diagnosis not present

## 2019-10-25 DIAGNOSIS — Z79899 Other long term (current) drug therapy: Secondary | ICD-10-CM | POA: Diagnosis not present

## 2019-10-25 DIAGNOSIS — Z8709 Personal history of other diseases of the respiratory system: Secondary | ICD-10-CM | POA: Diagnosis not present

## 2019-10-25 LAB — COMPREHENSIVE METABOLIC PANEL
ALT: 13 U/L (ref 0–44)
AST: 18 U/L (ref 15–41)
Albumin: 4.4 g/dL (ref 3.5–5.0)
Alkaline Phosphatase: 56 U/L (ref 38–126)
Anion gap: 7 (ref 5–15)
BUN: 8 mg/dL (ref 6–20)
CO2: 29 mmol/L (ref 22–32)
Calcium: 9.2 mg/dL (ref 8.9–10.3)
Chloride: 104 mmol/L (ref 98–111)
Creatinine, Ser: 0.82 mg/dL (ref 0.44–1.00)
GFR calc Af Amer: >60 mL/min (ref >60)
GFR calc non Af Amer: >60 mL/min (ref >60)
Glucose, Bld: 74 mg/dL (ref 70–99)
Potassium: 3.6 mmol/L (ref 3.5–5.1)
Sodium: 140 mmol/L (ref 135–145)
Total Bilirubin: 0.8 mg/dL (ref 0.3–1.2)
Total Protein: 7.4 g/dL (ref 6.5–8.1)

## 2019-10-25 LAB — CBC
HCT: 43.1 % (ref 36.0–46.0)
Hemoglobin: 14.3 g/dL (ref 12.0–15.0)
MCH: 34 pg (ref 26.0–34.0)
MCHC: 33.2 g/dL (ref 30.0–36.0)
MCV: 102.6 fL — ABNORMAL HIGH (ref 80.0–100.0)
Platelets: 398 10*3/uL (ref 150–400)
RBC: 4.2 MIL/uL (ref 3.87–5.11)
RDW: 13.1 % (ref 11.5–15.5)
WBC: 6.2 10*3/uL (ref 4.0–10.5)
nRBC: 0 % (ref 0.0–0.2)

## 2019-10-25 MED ORDER — CEPHALEXIN 500 MG PO CAPS: 500.0000 mg | ORAL_CAPSULE | Freq: Three times a day (TID) | ORAL | 0 refills | Status: DC

## 2019-10-25 MED ORDER — SULFAMETHOXAZOLE-TRIMETHOPRIM 800-160 MG PO TABS: 1.0000 | ORAL_TABLET | Freq: Two times a day (BID) | ORAL | 0 refills | Status: DC

## 2019-10-25 MED ORDER — PIPERACILLIN-TAZOBACTAM 3.375 G IVPB 30 MIN
3.3750 g | Freq: Once | INTRAVENOUS | Status: AC
  Administered 2019-10-25: 3.375 g via INTRAVENOUS
  Filled 2019-10-25: qty 50

## 2019-10-25 NOTE — ED Triage Notes (Signed)
PT arrives via POV from home for reports of left foot scab/rash between third and fourth toes x 3 weeks. PT reports this started after getting a pedicure.  Pt reports left leg "feels like a toothache" x 3 days and reports a lymph node being swollen in the left groin. Pt taking humira for RA and was told by PCP to come for evaluation.   Pt states she has been having chills but no documented fever. Pt in NAD. Skin warm and dry. Noticeable flaky appearing wound between third and fourth toe on left foot.

## 2019-10-25 NOTE — Discharge Instructions (Signed)
Follow-up with your regular doctor for recheck in 2 days. Return emergency department worsening

## 2019-10-25 NOTE — ED Provider Notes (Signed)
[facility] Emergency Department Provider Note  ____________________________________________   First MD Initiated Contact with Patient 10/25/19 1644     (approximate)  I have reviewed the triage vital signs and the nursing notes.   HISTORY  Chief Complaint Leg Pain and Foot Pain    HPI Lisa Peters is a 39 y.o. female with a history of RA in which she takes Humira presents emergency department with concerns of a left foot infection. States she got a pedicure and noticed white scaly skin between her toes. Her regular physician started her on antifungal medication. She then started to feel achy, pain radiating up the leg, and had chills although no fever.  Symptoms have increased over the last 3 days. Pain rated at 5/10   Past Medical History:  Diagnosis Date  . Anxiety   . Depression    PTSD  . GERD (gastroesophageal reflux disease)   . 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 Rheumatoid arthritis ([place])    Rheumatoid    Patient Active Problem List   Diagnosis Date Noted  . S/P laparoscopic hysterectomy 04/14/2018  . Hydrosalpinx 02/02/2018  . Adenomyosis 02/02/2018  . Chronic pelvic pain in female 02/02/2018  . Abnormal uterine bleeding 02/02/2018  . Anxiety 08/17/2017  . Allergic rhinitis 08/17/2017  . Epigastric pain 05/12/2017  . Fatigue 05/12/2017  . Gastroesophageal reflux disease without esophagitis 05/12/2017  . Insomnia 05/12/2017  . Ankylosing spondylitis of multiple sites in spine ([place]) 02/23/2017  . Hyperlipidemia, mixed 06/12/2016  . DUB (dysfunctional uterine bleeding) 05/25/2016  . Long term current use of non-steroidal anti-inflammatories (NSAID) 05/25/2016  . 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 Chronic neck pain 12/24/2015  . High risk medication use 12/24/2015  . HLA B27 (HLA B27 positive) 12/24/2015  . Migraine without status migrainosus, not intractable  12/24/2015  . Morning stiffness of joints 12/24/2015  . Numbness and tingling in both hands 12/24/2015  . Pain of both elbows 12/24/2015  . Risk for falls 12/24/2015  . 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 Poor concentration 03/22/2015    Past Surgical History:  Procedure Laterality Date  . COLPOSCOPY    . CYSTOSCOPY N/A 04/14/2018   Procedure: CYSTOSCOPY;  Surgeon: Malachy Mood, MD;  Location: [facility] ORS;  Service: Gynecology;  Laterality: N/A;  . FINGER SURGERY  2014  . LAPAROSCOPIC HYSTERECTOMY N/A 04/14/2018   Procedure: HYSTERECTOMY TOTAL LAPAROSCOPIC BILATERAL SALPINGECTOMY;  Surgeon: Malachy Mood, MD;  Location: [facility] ORS;  Service: Gynecology;  Laterality: N/A;    Prior to Admission medications   Medication Sig Start Date End Date Taking? Authorizing Provider  Adalimumab (HUMIRA) 40 MG/0.8ML PSKT Inject 40 mg into the skin every 14 (fourteen) days.     [provider]  ALPRAZolam Duanne Moron) 0.5 MG tablet Take 0.5 mg by mouth 2 (two) times daily.    [provider]  Brexpiprazole (REXULTI) 1 MG TABS Take 1 mg by mouth daily.     [provider]  cephALEXin (KEFLEX) 500 MG capsule Take 1 capsule (500 mg total) by mouth 3 (three) times daily. 10/25/19   Areg Bialas, Linden Dolin, PA-C  Cholecalciferol (VITAMIN D) 2000 units CAPS Take 2,000 Units by mouth daily.    [provider]  ibuprofen (ADVIL,MOTRIN) 200 MG tablet Take 4 tablets (800 mg total) by mouth daily. 04/15/18   Malachy Mood, MD  ibuprofen (ADVIL,MOTRIN) 600 MG tablet Take 1 tablet (600 mg total) by mouth every 6 (  six) hours as needed for mild pain or cramping. 04/15/18   Malachy Mood, MD  mirtazapine (REMERON) 30 MG tablet Take 30 mg by mouth at bedtime.    [provider]  oxyCODONE-acetaminophen (PERCOCET/ROXICET) 5-325 MG tablet Take 1 tablet by mouth every 4 (four) hours as needed for moderate pain or severe pain. Patient not taking: Reported on 04/22/2018 04/15/18    Malachy Mood, MD  pantoprazole (PROTONIX) 40 MG tablet Take 1 tablet (40 mg total) by mouth daily. Patient taking differently: Take 40 mg by mouth 2 (two) times daily.  08/17/17   Juline Patch, MD  sulfamethoxazole-trimethoprim (BACTRIM DS) 800-160 MG tablet Take 1 tablet by mouth 2 (two) times daily. 10/25/19   Haily Caley, Linden Dolin, PA-C  Vilazodone HCl (VIIBRYD) 40 MG TABS Take 40 mg by mouth daily.    [provider]    Allergies Patient has no known allergies.  Family History  Problem Relation Age of Onset  . Diabetes Mother   . Arthritis Father   . Diabetes Father        hypoglycemia  . Stroke Paternal Grandmother   . Heart disease Paternal Grandfather     Social History Social History   Tobacco Use  . Smoking status: Former Smoker    Types: Cigarettes    Quit date: 04/06/2011    Years since quitting: 8.5  . Smokeless tobacco: Never Used  . Tobacco comment: quit 2015  Vaping Use  . Vaping Use: Never used  Substance Use Topics  . Alcohol use: Yes    Comment: occassionally  . Drug use: No    Review of Systems  Constitutional: No fever/chills Eyes: No visual changes. ENT: No sore throat. Respiratory: Denies cough Cardiovascular: Denies chest pain Gastrointestinal: Denies abdominal pain Genitourinary: Negative for dysuria. Musculoskeletal: Negative for back pain. Skin: Negative for rash. Psychiatric: no mood changes,     ____________________________________________   PHYSICAL EXAM:  VITAL SIGNS: ED Triage Vitals  Enc Vitals Group     BP 10/25/19 1415 118/89     Pulse Rate 10/25/19 1415 72     Resp 10/25/19 1415 19     Temp 10/25/19 1415 98.6 F (37 C)     Temp Source 10/25/19 1415 Oral     SpO2 10/25/19 1415 99 %     Weight 10/25/19 1424 130 lb (59 kg)     Height 10/25/19 1424 '5\' 4"'  (1.626 m)     Head Circumference --      Peak Flow --      Pain Score 10/25/19 1423 5     Pain Loc --      Pain Edu? --      Excl. in [place]? --      Constitutional: Alert and oriented. Well appearing and in no acute distress. Eyes: Conjunctivae are normal.  Head: Atraumatic. Nose: No congestion/rhinnorhea. Mouth/Throat: Mucous membranes are moist.   Neck:  supple no lymphadenopathy noted Cardiovascular: Normal rate, regular rhythm.  Respiratory: Normal respiratory effort.  No retractions,  GU: deferred Musculoskeletal: FROM all extremities, warm and well perfused, left foot has a open wound on the plantar surface next to the left fourth and third toe, scaly skin typical of tinea pedis was also noted, red streak noted on the dorsum of the left foot extending up to the anterior lower leg, inguinal area at the left thigh is tender along the lymph nodes. There is no posterior calf tenderness. Negative Homans' sign. Neurologic:  Normal speech and language.  Skin:  Skin is warm, dry and intact. No rash noted. Psychiatric: Mood and affect are normal. Speech and behavior are normal.  ____________________________________________   LABS (all labs ordered are listed, but only abnormal results are displayed)  Labs Reviewed  CBC - Abnormal; Notable for the following components:      Result Value   MCV 102.6 (*)    All other components within normal limits  COMPREHENSIVE METABOLIC PANEL  URINALYSIS, COMPLETE (UACMP) WITH MICROSCOPIC   ____________________________________________   ____________________________________________  RADIOLOGY  X-ray of the left foot is negative  ____________________________________________   PROCEDURES  Procedure(s) performed: No  Procedures    ____________________________________________   INITIAL IMPRESSION / ASSESSMENT AND PLAN / ED COURSE  Pertinent labs & imaging results that were available during my care of the patient were reviewed by me and considered in my medical decision making (see chart for details).   Patient is 39 year old female with history of RA presents emergency  department with concerns of a left foot infection. Patient has taken Humira for 4 years. Saw her PCP they placed her on antifungal medication. Sent her to the ED today due to the redness swelling and streak on the top of her foot.  Physical exam does show an open wound on the plantar surface of the left foot. No foreign body. Some scaly skin typical of tinea pedis noted between the toes. Red streak noted on the dorsum that extends from the foot up into the lower leg. Neurovascular was intact.  DDx: Cellulitis, osteomyelitis, sepsis  CBC and metabolic panel are both normal x-ray of the left foot is normal  I do not feel the patient has sepsis as her vitals are normal and her CBC is normal. Therefore we will treat her with IV antibiotics today and give her outpatient oral antibiotics. She is to return if worsening. Follow-up with your regular doctor if not better to 3 days. Continue her antifungal medications. She states she understands. She is discharged stable condition.    Lisa Peters was evaluated in Emergency Department on 10/25/2019 for the symptoms described in the history of present illness. She was evaluated in the context of the global COVID-19 pandemic, which necessitated consideration that the patient might be at risk for infection with the SARS-CoV-2 virus that causes COVID-19. Institutional protocols and algorithms that pertain to the evaluation of patients at risk for COVID-19 are in a state of rapid change based on information released by regulatory bodies including the CDC and federal and state organizations. These policies and algorithms were followed during the patient's care in the ED.   As part of my medical decision making, I reviewed the following data within the [place] notes reviewed and incorporated, Labs reviewed , Old chart reviewed, Radiograph reviewed , Notes from prior ED visits and [place] Controlled Substance  Database  ____________________________________________   FINAL CLINICAL IMPRESSION(S) / ED DIAGNOSES  Final diagnoses:  Cellulitis of left foot      NEW MEDICATIONS STARTED DURING THIS VISIT:  New Prescriptions   CEPHALEXIN (KEFLEX) 500 MG CAPSULE    Take 1 capsule (500 mg total) by mouth 3 (three) times daily.   SULFAMETHOXAZOLE-TRIMETHOPRIM (BACTRIM DS) 800-160 MG TABLET    Take 1 tablet by mouth 2 (two) times daily.     Note:  This document was prepared using Dragon voice recognition software and may include unintentional dictation errors.    Versie Starks, PA-C 10/25/19 1908    Vanessa [place],  MD 10/26/19 1611

## 2019-10-27 ENCOUNTER — Other Ambulatory Visit: Payer: Self-pay

## 2019-10-27 ENCOUNTER — Encounter: Payer: Self-pay | Admitting: Emergency Medicine

## 2019-10-27 ENCOUNTER — Emergency Department
Admission: EM | Admit: 2019-10-27 | Discharge: 2019-10-27 | Disposition: A | Discharge status: Home / Self Care | Payer: BC Managed Care – PPO | Attending: Emergency Medicine | Admitting: Emergency Medicine

## 2019-10-27 DIAGNOSIS — T368X5A Adverse effect of other systemic antibiotics, initial encounter: Secondary | ICD-10-CM | POA: Insufficient documentation

## 2019-10-27 DIAGNOSIS — Z79899 Other long term (current) drug therapy: Secondary | ICD-10-CM | POA: Insufficient documentation

## 2019-10-27 DIAGNOSIS — Z882 Allergy status to sulfonamides status: Secondary | ICD-10-CM | POA: Insufficient documentation

## 2019-10-27 DIAGNOSIS — R21 Rash and other nonspecific skin eruption: Secondary | ICD-10-CM | POA: Diagnosis present

## 2019-10-27 DIAGNOSIS — T7840XA Allergy, unspecified, initial encounter: Secondary | ICD-10-CM

## 2019-10-27 LAB — CBC WITH DIFFERENTIAL/PLATELET
Abs Immature Granulocytes: 0.02 10*3/uL (ref 0.00–0.07)
Basophils Absolute: 0.1 10*3/uL (ref 0.0–0.1)
Basophils Relative: 1 %
Eosinophils Absolute: 0.2 10*3/uL (ref 0.0–0.5)
Eosinophils Relative: 3 %
HCT: 37.1 % (ref 36.0–46.0)
Hemoglobin: 12.8 g/dL (ref 12.0–15.0)
Immature Granulocytes: 0 %
Lymphocytes Relative: 24 %
Lymphs Abs: 1.3 10*3/uL (ref 0.7–4.0)
MCH: 34.3 pg — ABNORMAL HIGH (ref 26.0–34.0)
MCHC: 34.5 g/dL (ref 30.0–36.0)
MCV: 99.5 fL (ref 80.0–100.0)
Monocytes Absolute: 0.5 10*3/uL (ref 0.1–1.0)
Monocytes Relative: 8 %
Neutro Abs: 3.6 10*3/uL (ref 1.7–7.7)
Neutrophils Relative %: 64 %
Platelets: 322 10*3/uL (ref 150–400)
RBC: 3.73 MIL/uL — ABNORMAL LOW (ref 3.87–5.11)
RDW: 12.9 % (ref 11.5–15.5)
WBC: 5.6 10*3/uL (ref 4.0–10.5)
nRBC: 0 % (ref 0.0–0.2)

## 2019-10-27 LAB — BASIC METABOLIC PANEL
Anion gap: 8 (ref 5–15)
BUN: 6 mg/dL (ref 6–20)
CO2: 25 mmol/L (ref 22–32)
Calcium: 9 mg/dL (ref 8.9–10.3)
Chloride: 106 mmol/L (ref 98–111)
Creatinine, Ser: 0.95 mg/dL (ref 0.44–1.00)
GFR calc Af Amer: >60 mL/min (ref >60)
GFR calc non Af Amer: >60 mL/min (ref >60)
Glucose, Bld: 101 mg/dL — ABNORMAL HIGH (ref 70–99)
Potassium: 3.4 mmol/L — ABNORMAL LOW (ref 3.5–5.1)
Sodium: 139 mmol/L (ref 135–145)

## 2019-10-27 MED ORDER — DEXAMETHASONE SODIUM PHOSPHATE 10 MG/ML IJ SOLN
10.0000 mg | Freq: Once | INTRAMUSCULAR | Status: AC
  Administered 2019-10-27: 10 mg via INTRAMUSCULAR
  Filled 2019-10-27: qty 1

## 2019-10-27 MED ORDER — PREDNISONE 20 MG PO TABS: 40.0000 mg | ORAL_TABLET | Freq: Every day | ORAL | 0 refills | Status: AC

## 2019-10-27 MED ORDER — FAMOTIDINE 20 MG PO TABS
20.0000 mg | ORAL_TABLET | Freq: Once | ORAL | Status: AC
  Administered 2019-10-27: 20 mg via ORAL
  Filled 2019-10-27: qty 1

## 2019-10-27 MED ORDER — FAMOTIDINE 20 MG PO TABS: 20.0000 mg | ORAL_TABLET | Freq: Two times a day (BID) | ORAL | 0 refills | Status: DC

## 2019-10-27 NOTE — Discharge Instructions (Addendum)
You are being treated for a drug reaction to the Septra (sulfa) antibiotic you were started on. STOP the Septra immediately. Continue to take the Cephalexin as directed. Take the famotidine and prednisone for inflammation as directed. Keep the wound clean, dry, and covered. Follow-up with Dr. Ginette Pitman for continued care.

## 2019-10-27 NOTE — ED Triage Notes (Signed)
Pt to ED via POV c/o allergic reaction. Pt states that she was started on sulfa 2 days ago for infection in her toe. Pt started having hives and itching, palpitations, abdominal pain, nausea and headache after taking the sulfa. Pt took her morning dose of sulfa this morning but was told by her MD to stop the medication. Pt is currently in NAD.

## 2019-10-27 NOTE — ED Notes (Signed)
Pt with rash that started yesterday - denies throat or airway swelling. Pt has not taken benadryl or simular.

## 2019-10-27 NOTE — ED Provider Notes (Signed)
[facility] Emergency Department Provider Note ____________________________________________  Time seen: 1742  I have reviewed the triage vital signs and the nursing notes.  HISTORY  Chief Complaint  Allergic Reaction  HPI SENOVIA GAUER is a 39 y.o. female presents her self to the ED for evaluation of an itchy rash that developed about 24 hours prior.  Patient describes she was evaluated here in the ED,, 2 days prior, for an infection to the foot.  He started on Keflex and Bactrim at that time.  Patient is examined occasions as prescribed, but developed an itchy rash to generalized body on yesterday.  She spoke to her primary provider who suggested that she discontinue the Bactrim and report to the local urgent care.  She presents to the ED after she was referred from local urgent care.  She denies any swelling of the mouth, throat, or tongue.  She denies any difficulty controlling oral secretions or breathing.  She also denies any chest pain, shortness of breath, or syncope.  Patient has no known drug allergies at this time.  She presents with some improvement to the foot wound, but complains of generalized itchy rash.  Past Medical History:  Diagnosis Date  . Anxiety   . Depression    PTSD  . GERD (gastroesophageal reflux disease)   . 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 Rheumatoid arthritis ([place])    Rheumatoid    Patient Active Problem List   Diagnosis Date Noted  . S/P laparoscopic hysterectomy 04/14/2018  . Hydrosalpinx 02/02/2018  . Adenomyosis 02/02/2018  . Chronic pelvic pain in female 02/02/2018  . Abnormal uterine bleeding 02/02/2018  . Anxiety 08/17/2017  . Allergic rhinitis 08/17/2017  . Epigastric pain 05/12/2017  . Fatigue 05/12/2017  . Gastroesophageal reflux disease without esophagitis 05/12/2017  . Insomnia 05/12/2017  . Ankylosing spondylitis of multiple sites in spine ([place]) 02/23/2017  . Hyperlipidemia,  mixed 06/12/2016  . DUB (dysfunctional uterine bleeding) 05/25/2016  . Long term current use of non-steroidal anti-inflammatories (NSAID) 05/25/2016  . 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 Chronic neck pain 12/24/2015  . High risk medication use 12/24/2015  . HLA B27 (HLA B27 positive) 12/24/2015  . Migraine without status migrainosus, not intractable 12/24/2015  . Morning stiffness of joints 12/24/2015  . Numbness and tingling in both hands 12/24/2015  . Pain of both elbows 12/24/2015  . Risk for falls 12/24/2015  . 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 Poor concentration 03/22/2015    Past Surgical History:  Procedure Laterality Date  . COLPOSCOPY    . CYSTOSCOPY N/A 04/14/2018   Procedure: CYSTOSCOPY;  Surgeon: Malachy Mood, MD;  Location: [facility] ORS;  Service: Gynecology;  Laterality: N/A;  . FINGER SURGERY  2014  . LAPAROSCOPIC HYSTERECTOMY N/A 04/14/2018   Procedure: HYSTERECTOMY TOTAL LAPAROSCOPIC BILATERAL SALPINGECTOMY;  Surgeon: Malachy Mood, MD;  Location: [facility] ORS;  Service: Gynecology;  Laterality: N/A;    Prior to Admission medications   Medication Sig Start Date End Date Taking? Authorizing Provider  Adalimumab (HUMIRA) 40 MG/0.8ML PSKT Inject 40 mg into the skin every 14 (fourteen) days.     [provider]  ALPRAZolam Duanne Moron) 0.5 MG tablet Take 0.5 mg by mouth 2 (two) times daily.    [provider]  Brexpiprazole (REXULTI) 1 MG TABS Take 1 mg by mouth daily.     [provider]  cephALEXin (KEFLEX) 500 MG capsule Take 1 capsule (500 mg total) by  mouth 3 (three) times daily. 10/25/19   Fisher, Linden Dolin, PA-C  Cholecalciferol (VITAMIN D) 2000 units CAPS Take 2,000 Units by mouth daily.    [provider]  ibuprofen (ADVIL,MOTRIN) 200 MG tablet Take 4 tablets (800 mg total) by mouth daily. 04/15/18   Malachy Mood, MD  ibuprofen (ADVIL,MOTRIN) 600 MG tablet Take 1 tablet (600 mg total) by mouth  every 6 (six) hours as needed for mild pain or cramping. 04/15/18   Malachy Mood, MD  mirtazapine (REMERON) 30 MG tablet Take 30 mg by mouth at bedtime.    [provider]  oxyCODONE-acetaminophen (PERCOCET/ROXICET) 5-325 MG tablet Take 1 tablet by mouth every 4 (four) hours as needed for moderate pain or severe pain. Patient not taking: Reported on 04/22/2018 04/15/18   Malachy Mood, MD  pantoprazole (PROTONIX) 40 MG tablet Take 1 tablet (40 mg total) by mouth daily. Patient taking differently: Take 40 mg by mouth 2 (two) times daily.  08/17/17   Juline Patch, MD  sulfamethoxazole-trimethoprim (BACTRIM DS) 800-160 MG tablet Take 1 tablet by mouth 2 (two) times daily. 10/25/19   Fisher, Linden Dolin, PA-C  Vilazodone HCl (VIIBRYD) 40 MG TABS Take 40 mg by mouth daily.    [provider]    Allergies Sulfa antibiotics  Family History  Problem Relation Age of Onset  . Diabetes Mother   . Arthritis Father   . Diabetes Father        hypoglycemia  . Stroke Paternal Grandmother   . Heart disease Paternal Grandfather     Social History Social History   Tobacco Use  . Smoking status: Former Smoker    Types: Cigarettes    Quit date: 04/06/2011    Years since quitting: 8.5  . Smokeless tobacco: Never Used  . Tobacco comment: quit 2015  Vaping Use  . Vaping Use: Never used  Substance Use Topics  . Alcohol use: Yes    Comment: occassionally  . Drug use: No    Review of Systems  Constitutional: Negative for fever. Eyes: Negative for visual changes. ENT: Negative for sore throat. Cardiovascular: Negative for chest pain. Respiratory: Negative for shortness of breath. Gastrointestinal: Negative for abdominal pain, vomiting and diarrhea. Musculoskeletal: Negative for back pain. Skin: Positive for rash. Neurological: Negative for headaches, focal weakness or numbness. ____________________________________________  PHYSICAL EXAM:  VITAL SIGNS: ED Triage  Vitals  Enc Vitals Group     BP 10/27/19 1433 112/74     Pulse Rate 10/27/19 1433 67     Resp 10/27/19 1433 16     Temp 10/27/19 1433 98.4 F (36.9 C)     Temp Source 10/27/19 1433 Oral     SpO2 10/27/19 1433 100 %     Weight --      Height --      Head Circumference --      Peak Flow --      Pain Score 10/27/19 1436 0     Pain Loc --      Pain Edu? --      Excl. in [place]? --     Constitutional: Alert and oriented. Well appearing and in no distress. Head: Normocephalic and atraumatic. Eyes: Conjunctivae are normal. PERRL. Normal extraocular movements Ears: Canals clear. TMs intact bilaterally. Nose: No congestion/rhinorrhea/epistaxis. Mouth/Throat: Mucous membranes are moist. No oral lesions noted.  Neck: Supple. No thyromegaly. Cardiovascular: Normal rate, regular rhythm. Normal distal pulses. Respiratory: Normal respiratory effort. No wheezes/rales/rhonchi. Musculoskeletal: Nontender with normal range of motion in  all extremities.  Neurologic:  Normal gait without ataxia. Normal speech and language. No gross focal neurologic deficits are appreciated. Skin:  Skin is warm, dry and intact. Diffuse, erythematous, maculopapular rash noted to the trunk and extremities.  ____________________________________________  LABORATORY   Labs Reviewed  CBC WITH DIFFERENTIAL/PLATELET - Abnormal; Notable for the following components:      Result Value   RBC 3.73 (*)    MCH 34.3 (*)    All other components within normal limits  BASIC METABOLIC PANEL - Abnormal; Notable for the following components:   Potassium 3.4 (*)    Glucose, Bld 101 (*)    All other components within normal limits   ____________________________________________  PROCEDURES  Decadron 10 mg IM Famotidine 20 mg PO  Procedures ____________________________________________  INITIAL IMPRESSION / ASSESSMENT AND PLAN / ED COURSE  Patient with ED evaluation of a drug reaction to recently prescribed Septra.  She is  otherwise stable without signs of acute respiratory distress or anaphylaxis.  Patient's been treated empirically with an IM dose of Decadron and a normal dose of famotidine.  She will dose Benadryl as directed when she gets home.  She is also advised to discontinue the Septra immediately.  She will continue the previously prescribed cephalexin for continued wound management.  She will follow with Dr. Roque Lias as discussed.  Return precautions have been reviewed.  ANNA BEAIRD was evaluated in Emergency Department on 10/27/2019 for the symptoms described in the history of present illness. She was evaluated in the context of the global COVID-19 pandemic, which necessitated consideration that the patient might be at risk for infection with the SARS-CoV-2 virus that causes COVID-19. Institutional protocols and algorithms that pertain to the evaluation of patients at risk for COVID-19 are in a state of rapid change based on information released by regulatory bodies including the CDC and federal and state organizations. These policies and algorithms were followed during the patient's care in the ED. ____________________________________________  FINAL CLINICAL IMPRESSION(S) / ED DIAGNOSES  Final diagnoses:  Allergic reaction to drug, initial encounter      Melvenia Needles, PA-C 10/27/19 1811    Delman Kitten, MD 10/29/19 0006

## 2019-11-08 DIAGNOSIS — Z872 Personal history of diseases of the skin and subcutaneous tissue: Secondary | ICD-10-CM | POA: Insufficient documentation

## 2020-02-19 ENCOUNTER — Ambulatory Visit: Payer: Self-pay | Admitting: Obstetrics and Gynecology

## 2020-04-01 ENCOUNTER — Ambulatory Visit (INDEPENDENT_AMBULATORY_CARE_PROVIDER_SITE_OTHER): Payer: PRIVATE HEALTH INSURANCE | Admitting: Obstetrics and Gynecology

## 2020-04-01 ENCOUNTER — Encounter: Payer: Self-pay | Admitting: Obstetrics and Gynecology

## 2020-04-01 ENCOUNTER — Other Ambulatory Visit: Payer: Self-pay

## 2020-04-01 VITALS — BP 98/58 | Ht 64.0 in | Wt 120.0 lb

## 2020-04-01 DIAGNOSIS — Z1329 Encounter for screening for other suspected endocrine disorder: Secondary | ICD-10-CM | POA: Diagnosis not present

## 2020-04-01 DIAGNOSIS — N644 Mastodynia: Secondary | ICD-10-CM | POA: Diagnosis not present

## 2020-04-01 DIAGNOSIS — Z01419 Encounter for gynecological examination (general) (routine) without abnormal findings: Secondary | ICD-10-CM | POA: Diagnosis not present

## 2020-04-01 DIAGNOSIS — Z1239 Encounter for other screening for malignant neoplasm of breast: Secondary | ICD-10-CM

## 2020-04-01 DIAGNOSIS — Z131 Encounter for screening for diabetes mellitus: Secondary | ICD-10-CM

## 2020-04-01 DIAGNOSIS — Z13220 Encounter for screening for lipoid disorders: Secondary | ICD-10-CM

## 2020-04-01 NOTE — Progress Notes (Signed)
No concerns, MMG. RM 5

## 2020-04-01 NOTE — Patient Instructions (Signed)
[facility] [address] [place] [address]  [facility] [place]  [address] [place] [address]  Phone: [phone]  

## 2020-04-01 NOTE — Progress Notes (Signed)
Gynecology Annual Exam  PCP: Tracie Harrier, MD  Chief Complaint:  Chief Complaint  Patient presents with  . Gynecologic Exam    No concerns, MMG. RM 5    History of Present Illness: Patient is a 39 y.o. [id number] presents for annual exam. The patient has no complaints today.   LMP: No LMP recorded (lmp unknown). Patient has had a hysterectomy.  The patient is sexually active. She currently uses status post hysterectomy for contraception. She denies dyspareunia.  The patient does perform self breast exams.  There is no notable family history of breast or ovarian cancer in her family.  The patient wears seatbelts: yes.   The patient has regular exercise: not asked.    The patient denies current symptoms of depression.    Patient does report right breast pain.  Pain is located in the right lower inner quadrant.  No trauma or inciting event.  No overlying skin lesions or masses.    Review of Systems: Review of Systems  Constitutional: Negative for chills and fever.  HENT: Negative for congestion.   Respiratory: Negative for cough and shortness of breath.   Cardiovascular: Negative for chest pain and palpitations.  Gastrointestinal: Negative for abdominal pain, constipation, diarrhea, heartburn, nausea and vomiting.  Genitourinary: Negative for dysuria, frequency and urgency.  Skin: Negative for itching and rash.  Neurological: Negative for dizziness and headaches.  Endo/Heme/Allergies: Negative for polydipsia.  Psychiatric/Behavioral: Negative for depression.    Past Medical History:  Patient Active Problem List   Diagnosis Date Noted  . S/P laparoscopic hysterectomy 04/14/2018  . Hydrosalpinx 02/02/2018  . Adenomyosis 02/02/2018  . Chronic pelvic pain in female 02/02/2018  . Abnormal uterine bleeding 02/02/2018  . Anxiety 08/17/2017  . Allergic rhinitis 08/17/2017  . Epigastric pain 05/12/2017  . Fatigue 05/12/2017  . Gastroesophageal reflux disease without  esophagitis 05/12/2017  . Insomnia 05/12/2017  . 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 Hyperlipidemia, mixed 06/12/2016  . DUB (dysfunctional uterine bleeding) 05/25/2016  . Long term current use of non-steroidal anti-inflammatories (NSAID) 05/25/2016  . 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 Chronic neck pain 12/24/2015  . High risk medication use 12/24/2015  . HLA B27 (HLA B27 positive) 12/24/2015  . Migraine without status migrainosus, not intractable 12/24/2015  . Morning stiffness of joints 12/24/2015  . Numbness and tingling in both hands 12/24/2015  . Pain of both elbows 12/24/2015  . Risk for falls 12/24/2015  . 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 Poor concentration 03/22/2015    Past Surgical History:  Past Surgical History:  Procedure Laterality Date  . COLPOSCOPY    . CYSTOSCOPY N/A 04/14/2018   Procedure: CYSTOSCOPY;  Surgeon: Malachy Mood, MD;  Location: [facility] ORS;  Service: Gynecology;  Laterality: N/A;  . FINGER SURGERY  2014  . LAPAROSCOPIC HYSTERECTOMY N/A 04/14/2018   Procedure: HYSTERECTOMY TOTAL LAPAROSCOPIC BILATERAL SALPINGECTOMY;  Surgeon: Malachy Mood, MD;  Location: [facility] ORS;  Service: Gynecology;  Laterality: N/A;    Gynecologic History:  No LMP recorded (lmp unknown). Patient has had a hysterectomy. Contraception: status post hysterectomy  Obstetric History: [id number]  Family History:  Family History  Problem Relation Age of Onset  . Diabetes Mother   . Arthritis Father   . Diabetes Father        hypoglycemia  . Stroke Paternal Grandmother   . Heart disease Paternal Grandfather     Social History:  Social History   Socioeconomic History  .  Marital status: Single    Spouse name: Not on file  . Number of children: Not on file  . Years of education: Not on file  . Highest education level: Not on file  Occupational History  . Not on file  Tobacco Use  . Smoking  status: Former Smoker    Types: Cigarettes    Quit date: 04/06/2011    Years since quitting: 8.9  . Smokeless tobacco: Never Used  . Tobacco comment: quit 2015  Vaping Use  . Vaping Use: Never used  Substance and Sexual Activity  . Alcohol use: Yes    Comment: occassionally  . Drug use: No  . Sexual activity: Yes    Birth control/protection: Injection  Other Topics Concern  . Not on file  Social History Narrative  . Not on file   Social Determinants of Health   Financial Resource Strain:   . Difficulty of Paying Living Expenses: Not on file  Food Insecurity:   . Worried About Charity fundraiser in the Last Year: Not on file  . Ran Out of Food in the Last Year: Not on file  Transportation Needs:   . Lack of Transportation (Medical): Not on file  . Lack of Transportation (Non-Medical): Not on file  Physical Activity:   . Days of Exercise per Week: Not on file  . Minutes of Exercise per Session: Not on file  Stress:   . Feeling of Stress : Not on file  Social Connections:   . Frequency of Communication with Friends and Family: Not on file  . Frequency of Social Gatherings with Friends and Family: Not on file  . Attends Religious Services: Not on file  . Active Member of Clubs or Organizations: Not on file  . Attends Archivist Meetings: Not on file  . Marital Status: Not on file  Intimate Partner Violence:   . Fear of Current or Ex-Partner: Not on file  . Emotionally Abused: Not on file  . Physically Abused: Not on file  . Sexually Abused: Not on file    Allergies:  Allergies  Allergen Reactions  . Sulfa Antibiotics Rash    Medications: Prior to Admission medications   Medication Sig Start Date End Date Taking? Authorizing Provider  Adalimumab (HUMIRA) 40 MG/0.8ML PSKT Inject 40 mg into the skin every 14 (fourteen) days.     [provider]  ALPRAZolam Duanne Moron) 0.5 MG tablet Take 0.5 mg by mouth 2 (two) times daily.    [provider]   Brexpiprazole (REXULTI) 1 MG TABS Take 1 mg by mouth daily.     [provider]  cephALEXin (KEFLEX) 500 MG capsule Take 1 capsule (500 mg total) by mouth 3 (three) times daily. 10/25/19   Fisher, Linden Dolin, PA-C  Cholecalciferol (VITAMIN D) 2000 units CAPS Take 2,000 Units by mouth daily.    [provider]  famotidine (PEPCID) 20 MG tablet Take 1 tablet (20 mg total) by mouth 2 (two) times daily for 7 days. 10/27/19 11/03/19  Menshew, Dannielle Karvonen, PA-C  mirtazapine (REMERON) 30 MG tablet Take 30 mg by mouth at bedtime.    [provider]  pantoprazole (PROTONIX) 40 MG tablet Take 1 tablet (40 mg total) by mouth daily. Patient taking differently: Take 40 mg by mouth 2 (two) times daily.  08/17/17   Juline Patch, MD  Vilazodone HCl (VIIBRYD) 40 MG TABS Take 40 mg by mouth daily.    [provider]  Physical Exam Vitals: Blood pressure (!) 98/58, height _0  (1.626 m), weight 120 lb (54.4 kg).  General: NAD HEENT: normocephalic, anicteric Thyroid: no enlargement, no palpable nodules Pulmonary: No increased work of breathing, CTAB Cardiovascular: RRR, distal pulses 2+ Breast: Breast symmetrical, no tenderness, no palpable nodules or masses, no skin or nipple retraction present, no nipple discharge.  No axillary or supraclavicular lymphadenopathy. Abdomen: NABS, soft, non-tender, non-distended.  Umbilicus without lesions.  No hepatomegaly, splenomegaly or masses palpable. No evidence of hernia  Genitourinary:  External: Normal external female genitalia.  Normal urethral meatus, normal Bartholin's and Skene's glands.    Vagina: Normal vaginal mucosa, no evidence of prolapse.    Cervix: surgically absent  Uterus: surgically absent  Adnexa: ovaries non-enlarged, no adnexal masses  Rectal: deferred  Lymphatic: no evidence of inguinal lymphadenopathy Extremities: no edema, erythema, or tenderness Neurologic: Grossly intact Psychiatric: mood appropriate,  affect full  Female chaperone present for pelvic and breast  portions of the physical exam  No results found.  Assessment: 39 y.o. [id number] routine annual exam  Plan: Problem List Items Addressed This Visit    None    Visit Diagnoses    Encounter for gynecological examination without abnormal finding    -  Primary   Breast screening       Relevant Orders   MM 3D SCREEN BREAST BILATERAL   MM DIAG BREAST TOMO BILATERAL   US BREAST COMPLETE UNI RIGHT INC AXILLA   US BREAST COMPLETE UNI LEFT INC AXILLA   Mastodynia of right breast       Relevant Orders   MM DIAG BREAST TOMO BILATERAL   US BREAST COMPLETE UNI RIGHT INC AXILLA   US BREAST COMPLETE UNI LEFT INC AXILLA   Thyroid disorder screening       Relevant Orders   CMP14+LP+TP+TSH+CBC/Plt   Diabetes mellitus screening       Relevant Orders   CMP14+LP+TP+TSH+CBC/Plt   Lipid screening       Relevant Orders   CMP14+LP+TP+TSH+CBC/Plt      1) Mammogram - recommend yearly screening mammogram.  Mammogram Was ordered today - diagnostic mammogram given right mastodynia  2) STI screening  was notoffered and therefore not obtained  3) ASCCP guidelines and rational discussed.  Patient opts for discontinue secondary to prior hysterectomy screening interval  4) Contraception - the patient is currently using  status post hysterectomy.  She is not currently in need of contraception secondary to being sterile  5) Colonoscopy -- Screening recommended starting at age 90 for average risk individuals, age 6 for individuals deemed at increased risk (including African Americans) and recommended to continue until age 48.  For patient age 25-85 individualized approach is recommended.  Gold standard screening is via colonoscopy, Cologuard screening is an acceptable alternative for patient unwilling or unable to undergo colonoscopy.  "Colorectal cancer screening for average?risk adults: 2018 guideline update from the American Cancer Society"CA: A  Cancer Journal for Clinicians: Oct 07, 2016   6) Routine healthcare maintenance including cholesterol, diabetes screening discussed Ordered today  7) Return in about 1 year (around 04/01/2021) for annual.   Malachy Mood, MD, [facility], [facility] 04/01/2020, 9:00 AM

## 2020-04-02 LAB — CMP14+LP+TP+TSH+CBC/PLT
ALT: 9 IU/L (ref 0–32)
AST: 15 IU/L (ref 0–40)
Albumin/Globulin Ratio: 2.5 — ABNORMAL HIGH (ref 1.2–2.2)
Albumin: 4.7 g/dL (ref 3.8–4.8)
Alkaline Phosphatase: 53 IU/L (ref 44–121)
BUN/Creatinine Ratio: 9 (ref 9–23)
BUN: 6 mg/dL (ref 6–24)
Bilirubin Total: 0.5 mg/dL (ref 0.0–1.2)
CO2: 25 mmol/L (ref 20–29)
Calcium: 9.2 mg/dL (ref 8.7–10.2)
Chloride: 103 mmol/L (ref 96–106)
Cholesterol, Total: 195 mg/dL (ref 100–199)
Creatinine, Ser: 0.67 mg/dL (ref 0.57–1.00)
Free Thyroxine Index: 2 (ref 1.2–4.9)
GFR calc Af Amer: 127 mL/min/{1.73_m2} (ref >59)
GFR calc non Af Amer: 110 mL/min/{1.73_m2} (ref >59)
Globulin, Total: 1.9 g/dL (ref 1.5–4.5)
Glucose: 82 mg/dL (ref 65–99)
HDL: 61 mg/dL (ref >39)
Hematocrit: 37.3 % (ref 34.0–46.6)
Hemoglobin: 12.4 g/dL (ref 11.1–15.9)
LDL Chol Calc (NIH): 117 mg/dL — ABNORMAL HIGH (ref 0–99)
LDL/HDL Ratio: 1.9 ratio (ref 0.0–3.2)
MCH: 33.3 pg — ABNORMAL HIGH (ref 26.6–33.0)
MCHC: 33.2 g/dL (ref 31.5–35.7)
MCV: 100 fL — ABNORMAL HIGH (ref 79–97)
Platelets: 429 10*3/uL (ref 150–450)
Potassium: 4.2 mmol/L (ref 3.5–5.2)
RBC: 3.72 x10E6/uL — ABNORMAL LOW (ref 3.77–5.28)
RDW: 11.4 % — ABNORMAL LOW (ref 11.7–15.4)
Sodium: 140 mmol/L (ref 134–144)
T3 Uptake Ratio: 23 % — ABNORMAL LOW (ref 24–39)
T4, Total: 8.6 ug/dL (ref 4.5–12.0)
TSH: 2.99 u[IU]/mL (ref 0.450–4.500)
Total Protein: 6.6 g/dL (ref 6.0–8.5)
Triglycerides: 93 mg/dL (ref 0–149)
VLDL Cholesterol Cal: 17 mg/dL (ref 5–40)
WBC: 6.2 10*3/uL (ref 3.4–10.8)

## 2020-04-09 NOTE — Addendum Note (Signed)
Addended by: Dorthula Nettles on: 04/09/2020 12:46 PM   Modules accepted: Orders, Level of Service

## 2020-04-11 ENCOUNTER — Emergency Department: Payer: BC Managed Care – PPO

## 2020-04-11 ENCOUNTER — Emergency Department
Admission: EM | Admit: 2020-04-11 | Discharge: 2020-04-11 | Disposition: A | Discharge status: Home / Self Care | Payer: BC Managed Care – PPO | Attending: Student in an Organized Health Care Education/Training Program | Admitting: Student in an Organized Health Care Education/Training Program

## 2020-04-11 ENCOUNTER — Other Ambulatory Visit: Payer: Self-pay

## 2020-04-11 DIAGNOSIS — K219 Gastro-esophageal reflux disease without esophagitis: Secondary | ICD-10-CM | POA: Diagnosis not present

## 2020-04-11 DIAGNOSIS — N83201 Unspecified ovarian cyst, right side: Secondary | ICD-10-CM | POA: Insufficient documentation

## 2020-04-11 DIAGNOSIS — R1031 Right lower quadrant pain: Secondary | ICD-10-CM

## 2020-04-11 DIAGNOSIS — Z87891 Personal history of nicotine dependence: Secondary | ICD-10-CM | POA: Diagnosis not present

## 2020-04-11 LAB — COMPREHENSIVE METABOLIC PANEL
ALT: 12 U/L (ref 0–44)
AST: 15 U/L (ref 15–41)
Albumin: 4.5 g/dL (ref 3.5–5.0)
Alkaline Phosphatase: 44 U/L (ref 38–126)
Anion gap: 10 (ref 5–15)
BUN: 8 mg/dL (ref 6–20)
CO2: 25 mmol/L (ref 22–32)
Calcium: 9.2 mg/dL (ref 8.9–10.3)
Chloride: 103 mmol/L (ref 98–111)
Creatinine, Ser: 0.66 mg/dL (ref 0.44–1.00)
GFR, Estimated: >60 mL/min (ref >60)
Glucose, Bld: 91 mg/dL (ref 70–99)
Potassium: 3.8 mmol/L (ref 3.5–5.1)
Sodium: 138 mmol/L (ref 135–145)
Total Bilirubin: 0.8 mg/dL (ref 0.3–1.2)
Total Protein: 6.8 g/dL (ref 6.5–8.1)

## 2020-04-11 LAB — CBC
HCT: 36.3 % (ref 36.0–46.0)
Hemoglobin: 12.1 g/dL (ref 12.0–15.0)
MCH: 33.2 pg (ref 26.0–34.0)
MCHC: 33.3 g/dL (ref 30.0–36.0)
MCV: 99.5 fL (ref 80.0–100.0)
Platelets: 355 10*3/uL (ref 150–400)
RBC: 3.65 MIL/uL — ABNORMAL LOW (ref 3.87–5.11)
RDW: 12.1 % (ref 11.5–15.5)
WBC: 9.6 10*3/uL (ref 4.0–10.5)
nRBC: 0 % (ref 0.0–0.2)

## 2020-04-11 LAB — URINALYSIS, COMPLETE (UACMP) WITH MICROSCOPIC
Bacteria, UA: NONE SEEN
Bilirubin Urine: NEGATIVE
Glucose, UA: NEGATIVE mg/dL
Ketones, ur: NEGATIVE mg/dL
Leukocytes,Ua: NEGATIVE
Nitrite: NEGATIVE
Protein, ur: NEGATIVE mg/dL
Specific Gravity, Urine: 1.019 (ref 1.005–1.030)
pH: 6 (ref 5.0–8.0)

## 2020-04-11 LAB — LIPASE, BLOOD: Lipase: 25 U/L (ref 11–51)

## 2020-04-11 MED ORDER — PROMETHAZINE HCL 25 MG/ML IJ SOLN
12.5000 mg | Freq: Four times a day (QID) | INTRAMUSCULAR | Status: DC | PRN
  Administered 2020-04-11: 12.5 mg via INTRAVENOUS
  Filled 2020-04-11: qty 1

## 2020-04-11 MED ORDER — HYDROMORPHONE HCL 1 MG/ML IJ SOLN
0.5000 mg | INTRAMUSCULAR | Status: DC | PRN
  Administered 2020-04-11: 0.5 mg via INTRAVENOUS
  Filled 2020-04-11: qty 1

## 2020-04-11 MED ORDER — MORPHINE SULFATE (PF) 4 MG/ML IV SOLN
4.0000 mg | Freq: Once | INTRAVENOUS | Status: AC
  Administered 2020-04-11: 4 mg via INTRAVENOUS
  Filled 2020-04-11: qty 1

## 2020-04-11 MED ORDER — ONDANSETRON 4 MG PO TBDP: 4.0000 mg | ORAL_TABLET | Freq: Three times a day (TID) | ORAL | 0 refills | Status: DC | PRN

## 2020-04-11 MED ORDER — OXYCODONE HCL 5 MG PO TABS
5.0000 mg | ORAL_TABLET | Freq: Once | ORAL | Status: AC
  Administered 2020-04-11: 5 mg via ORAL
  Filled 2020-04-11: qty 1

## 2020-04-11 MED ORDER — OXYCODONE-ACETAMINOPHEN 5-325 MG PO TABS
1.0000 | ORAL_TABLET | ORAL | 0 refills | Status: DC | PRN
Start: 2020-04-11 — End: 2020-04-22

## 2020-04-11 MED ORDER — IOHEXOL 300 MG/ML  SOLN
100.0000 mL | Freq: Once | INTRAMUSCULAR | Status: AC | PRN
  Administered 2020-04-11: 100 mL via INTRAVENOUS
  Filled 2020-04-11: qty 100

## 2020-04-11 MED ORDER — KETOROLAC TROMETHAMINE 30 MG/ML IJ SOLN
15.0000 mg | Freq: Once | INTRAMUSCULAR | Status: AC
  Administered 2020-04-11: 15 mg via INTRAVENOUS
  Filled 2020-04-11: qty 1

## 2020-04-11 MED ORDER — IOHEXOL 9 MG/ML PO SOLN
500.0000 mL | Freq: Once | ORAL | Status: DC | PRN
  Filled 2020-04-11: qty 500

## 2020-04-11 MED ORDER — ONDANSETRON HCL 4 MG/2ML IJ SOLN
4.0000 mg | Freq: Once | INTRAMUSCULAR | Status: AC
  Administered 2020-04-11: 4 mg via INTRAVENOUS
  Filled 2020-04-11: qty 2

## 2020-04-11 NOTE — ED Triage Notes (Signed)
Pt to ED POV for RLQ that started last night.  +nausea, chills. Denies vomiting, diarrhea.  RLQ and LLQ tender upon palpitation

## 2020-04-11 NOTE — ED Notes (Signed)
First RN note:  Pt comes into the ED c/o right side abdominal pain with nausea.

## 2020-04-11 NOTE — ED Notes (Signed)
Dr Cherylann Banas to triage to assess for CT abdomen

## 2020-04-11 NOTE — ED Provider Notes (Signed)
[facility] Emergency Department Provider Note    First MD Initiated Contact with Patient 04/11/20 1656     (approximate)  I have reviewed the triage vital signs and the nursing notes.   HISTORY  Chief Complaint Abdominal Pain    HPI Lisa Peters is a 39 y.o. female below listed past medical history presents to the ER for evaluation of right lower quadrant pain associate with nausea and chills.  Not have any vomiting or diarrhea.  Has not had anything to eat or drink today.  Has never had pain like this before.  Denies any dysuria.  No flank pain.  No previous abdominal surgeries.    Past Medical History:  Diagnosis Date  . Anxiety   . Depression    PTSD  . GERD (gastroesophageal reflux disease)   . 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 Rheumatoid arthritis (HCC)    Rheumatoid   Family History  Problem Relation Age of Onset  . Diabetes Mother   . Arthritis Father   . Diabetes Father        hypoglycemia  . Stroke Paternal Grandmother   . Heart disease Paternal Grandfather    Past Surgical History:  Procedure Laterality Date  . COLPOSCOPY    . CYSTOSCOPY N/A 04/14/2018   Procedure: CYSTOSCOPY;  Surgeon: Malachy Mood, MD;  Location: [facility] ORS;  Service: Gynecology;  Laterality: N/A;  . FINGER SURGERY  2014  . LAPAROSCOPIC HYSTERECTOMY N/A 04/14/2018   Procedure: HYSTERECTOMY TOTAL LAPAROSCOPIC BILATERAL SALPINGECTOMY;  Surgeon: Malachy Mood, MD;  Location: [facility] ORS;  Service: Gynecology;  Laterality: N/A;   Patient Active Problem List   Diagnosis Date Noted  . S/P laparoscopic hysterectomy 04/14/2018  . Hydrosalpinx 02/02/2018  . Adenomyosis 02/02/2018  . Chronic pelvic pain in female 02/02/2018  . Abnormal uterine bleeding 02/02/2018  . Anxiety 08/17/2017  . Allergic rhinitis 08/17/2017  . Epigastric pain 05/12/2017  . Fatigue 05/12/2017  . Gastroesophageal reflux disease without esophagitis  05/12/2017  . Insomnia 05/12/2017  . Ankylosing spondylitis of multiple sites in spine ([place]) 02/23/2017  . Hyperlipidemia, mixed 06/12/2016  . DUB (dysfunctional uterine bleeding) 05/25/2016  . Long term current use of non-steroidal anti-inflammatories (NSAID) 05/25/2016  . 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 Chronic neck pain 12/24/2015  . High risk medication use 12/24/2015  . HLA B27 (HLA B27 positive) 12/24/2015  . Migraine without status migrainosus, not intractable 12/24/2015  . Morning stiffness of joints 12/24/2015  . Numbness and tingling in both hands 12/24/2015  . Pain of both elbows 12/24/2015  . Risk for falls 12/24/2015  . 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 Poor concentration 03/22/2015      Prior to Admission medications   Medication Sig Start Date End Date Taking? Authorizing Provider  Adalimumab (HUMIRA) 40 MG/0.8ML PSKT Inject 40 mg into the skin every 14 (fourteen) days.     [provider]  ALPRAZolam Duanne Moron) 0.5 MG tablet Take 0.5 mg by mouth 2 (two) times daily.    [provider]  Brexpiprazole (REXULTI) 1 MG TABS Take 1 mg by mouth daily.     [provider]  cephALEXin (KEFLEX) 500 MG capsule Take 1 capsule (500 mg total) by mouth 3 (three) times daily. Patient not taking: Reported on 04/01/2020 10/25/19   Versie Starks, PA-C  Cholecalciferol (VITAMIN D) 2000 units CAPS Take 2,000 Units by mouth daily.    [provider]  famotidine (PEPCID) 20 MG tablet Take 1 tablet (20 mg total) by mouth 2 (two) times daily for 7 days. 10/27/19 11/03/19  Menshew, Dannielle Karvonen, PA-C  mirtazapine (REMERON) 30 MG tablet Take 30 mg by mouth at bedtime.    [provider]  ondansetron (ZOFRAN ODT) 4 MG disintegrating tablet Take 1 tablet (4 mg total) by mouth every 8 (eight) hours as needed for nausea or vomiting. 04/11/20   Merlyn Lot, MD  oxyCODONE-acetaminophen (PERCOCET) 5-325 MG tablet  Take 1 tablet by mouth every 4 (four) hours as needed for severe pain. 04/11/20 04/11/21  Merlyn Lot, MD  pantoprazole (PROTONIX) 40 MG tablet Take 1 tablet (40 mg total) by mouth daily. Patient taking differently: Take 40 mg by mouth 2 (two) times daily.  08/17/17   Juline Patch, MD  Vilazodone HCl (VIIBRYD) 40 MG TABS Take 40 mg by mouth daily. Patient not taking: Reported on 04/01/2020    [provider]    Allergies Sulfa antibiotics    Social History Social History   Tobacco Use  . Smoking status: Former Smoker    Types: Cigarettes    Quit date: 04/06/2011    Years since quitting: 9.0  . Smokeless tobacco: Never Used  . Tobacco comment: quit 2015  Vaping Use  . Vaping Use: Never used  Substance Use Topics  . Alcohol use: Yes    Comment: occassionally  . Drug use: No    Review of Systems Patient denies headaches, rhinorrhea, blurry vision, numbness, shortness of breath, chest pain, edema, cough, abdominal pain, nausea, vomiting, diarrhea, dysuria, fevers, rashes or hallucinations unless otherwise stated above in HPI. ____________________________________________   PHYSICAL EXAM:  VITAL SIGNS: Vitals:   04/11/20 1526 04/11/20 2100  BP: 137/90 120/84  Pulse: 74 68  Resp: 18 17  Temp: 98.6 F (37 C)   SpO2: 100% 99%    Constitutional: Alert and oriented.  Eyes: Conjunctivae are normal.  Head: Atraumatic. Nose: No congestion/rhinnorhea. Mouth/Throat: Mucous membranes are moist.   Neck: No stridor. Painless ROM.  Cardiovascular: Normal rate, regular rhythm. Grossly normal heart sounds.  Good peripheral circulation. Respiratory: Normal respiratory effort.  No retractions. Lungs CTAB. Gastrointestinal: Soft but with ttp in RLQ.  No guarding or rebound. No distention. No abdominal bruits. No CVA tenderness. Genitourinary:  Musculoskeletal: No lower extremity tenderness nor edema.  No joint effusions. Neurologic:  Normal speech and language. No  gross focal neurologic deficits are appreciated. No facial droop Skin:  Skin is warm, dry and intact. No rash noted. Psychiatric: Mood and affect are normal. Speech and behavior are normal.  ____________________________________________   LABS (all labs ordered are listed, but only abnormal results are displayed)  Results for orders placed or performed during the hospital encounter of 04/11/20 (from the past 24 hour(s))  Lipase, blood     Status: None   Collection Time: 04/11/20  3:29 PM  Result Value Ref Range   Lipase 25 11 - 51 U/L  Comprehensive metabolic panel     Status: None   Collection Time: 04/11/20  3:29 PM  Result Value Ref Range   Sodium 138 135 - 145 mmol/L   Potassium 3.8 3.5 - 5.1 mmol/L   Chloride 103 98 - 111 mmol/L   CO2 25 22 - 32 mmol/L   Glucose, Bld 91 70 - 99 mg/dL   BUN 8 6 - 20 mg/dL   Creatinine, Ser 0.66 0.44 - 1.00 mg/dL   Calcium 9.2 8.9 - 10.3 mg/dL  Total Protein 6.8 6.5 - 8.1 g/dL   Albumin 4.5 3.5 - 5.0 g/dL   AST 15 15 - 41 U/L   ALT 12 0 - 44 U/L   Alkaline Phosphatase 44 38 - 126 U/L   Total Bilirubin 0.8 0.3 - 1.2 mg/dL   GFR, Estimated >60 >60 mL/min   Anion gap 10 5 - 15  CBC     Status: Abnormal   Collection Time: 04/11/20  3:29 PM  Result Value Ref Range   WBC 9.6 4.0 - 10.5 K/uL   RBC 3.65 (L) 3.87 - 5.11 MIL/uL   Hemoglobin 12.1 12.0 - 15.0 g/dL   HCT 36.3 36 - 46 %   MCV 99.5 80.0 - 100.0 fL   MCH 33.2 26.0 - 34.0 pg   MCHC 33.3 30.0 - 36.0 g/dL   RDW 12.1 11.5 - 15.5 %   Platelets 355 150 - 400 K/uL   nRBC 0.0 0.0 - 0.2 %  Urinalysis, Complete w Microscopic     Status: Abnormal   Collection Time: 04/11/20  6:17 PM  Result Value Ref Range   Color, Urine STRAW (A) YELLOW   APPearance CLEAR (A) CLEAR   Specific Gravity, Urine 1.019 1.005 - 1.030   pH 6.0 5.0 - 8.0   Glucose, UA NEGATIVE NEGATIVE mg/dL   Hgb urine dipstick SMALL (A) NEGATIVE   Bilirubin Urine NEGATIVE NEGATIVE   Ketones, ur NEGATIVE NEGATIVE mg/dL    Protein, ur NEGATIVE NEGATIVE mg/dL   Nitrite NEGATIVE NEGATIVE   Leukocytes,Ua NEGATIVE NEGATIVE   RBC / HPF 0-5 0 - 5 RBC/hpf   WBC, UA 0-5 0 - 5 WBC/hpf   Bacteria, UA NONE SEEN NONE SEEN   Squamous Epithelial / LPF 0-5 0 - 5   Mucus PRESENT    ____________________________________________  EKG My review and personal interpretation at Time: 15:33   Indication: abd pain  Rate: 65  Rhythm: sinus Axis: normal Other: normal intervals, no stemi ____________________________________________  RADIOLOGY  I personally reviewed all radiographic images ordered to evaluate for the above acute complaints and reviewed radiology reports and findings.  These findings were personally discussed with the patient.  Please see medical record for radiology report.  ____________________________________________   PROCEDURES  Procedure(s) performed:  Procedures    Critical Care performed: no ____________________________________________   INITIAL IMPRESSION / ASSESSMENT AND PLAN / ED COURSE  Pertinent labs & imaging results that were available during my care of the patient were reviewed by me and considered in my medical decision making (see chart for details).   DDX: Appendicitis, cystitis, pyelonephritis, stone, diverticulitis, hernia  LOUKISHA GUNNERSON is a 39 y.o. who presents to the ED with symptoms as described above.  Patient presenting with right lower quadrant pain presentation concerning for the above differential.  Do feel IV narcotic pain medication indicated will give fluids.  Do feel CT imaging clinically indicated rule out appendicitis given location of her pain.  She not febrile blood work is otherwise reassuring  Clinical Course as of Apr 11 2101  Thu Apr 11, 2020  1851 CT results reviewed with patient.  Have ordered ultrasound to further characterize   [PR]  2101 Discussed results with patient.  Also discussed case with Dr. Kenton Kingfisher of OB/GYN who has recommended to add on  additional ovarian markers which I have ordered.  Patient's pain is improved.  I do believe she stable and appropriate for close outpatient follow-up.  Have sent message to our clinic to see if  we can get an for urgent follow-up.  Have discussed with the patient and available family all diagnostics and treatments performed thus far and all questions were answered to the best of my ability. The patient demonstrates understanding and agreement with plan.    [PR]    Clinical Course User Index [PR] Merlyn Lot, MD    The patient was evaluated in Emergency Department today for the symptoms described in the history of present illness. He/she was evaluated in the context of the global COVID-19 pandemic, which necessitated consideration that the patient might be at risk for infection with the SARS-CoV-2 virus that causes COVID-19. Institutional protocols and algorithms that pertain to the evaluation of patients at risk for COVID-19 are in a state of rapid change based on information released by regulatory bodies including the CDC and federal and state organizations. These policies and algorithms were followed during the patient's care in the ED.  As part of my medical decision making, I reviewed the following data within the [facility] notes reviewed and incorporated, Labs reviewed, notes from prior ED visits and [place] Controlled Substance Database   ____________________________________________   FINAL CLINICAL IMPRESSION(S) / ED DIAGNOSES  Final diagnoses:  RLQ abdominal pain  Cyst of right ovary      NEW MEDICATIONS STARTED DURING THIS VISIT:  New Prescriptions   ONDANSETRON (ZOFRAN ODT) 4 MG DISINTEGRATING TABLET    Take 1 tablet (4 mg total) by mouth every 8 (eight) hours as needed for nausea or vomiting.   OXYCODONE-ACETAMINOPHEN (PERCOCET) 5-325 MG TABLET    Take 1 tablet by mouth every 4 (four) hours as needed for severe pain.     Note:  This document was  prepared using Dragon voice recognition software and may include unintentional dictation errors.    Merlyn Lot, MD 04/11/20 2102

## 2020-04-11 NOTE — Discharge Instructions (Signed)

## 2020-04-13 LAB — PREMENOPAUSAL INTERP: LOW

## 2020-04-13 LAB — POSTMENOPAUSAL INTERP: LOW

## 2020-04-13 LAB — OVARIAN MALIGNANCY RISK-ROMA
Cancer Antigen (CA) 125: 9.2 U/mL (ref 0.0–38.1)
HE4: 55.8 pmol/L (ref 0.0–63.6)
Postmenopausal ROMA: 0.93
Premenopausal ROMA: 0.92

## 2020-04-18 ENCOUNTER — Encounter: Payer: Self-pay | Admitting: Obstetrics and Gynecology

## 2020-04-18 ENCOUNTER — Ambulatory Visit: Payer: PRIVATE HEALTH INSURANCE | Admitting: Obstetrics and Gynecology

## 2020-04-18 ENCOUNTER — Other Ambulatory Visit: Payer: Self-pay

## 2020-04-18 ENCOUNTER — Telehealth: Payer: Self-pay | Admitting: Obstetrics and Gynecology

## 2020-04-18 VITALS — BP 110/68 | Ht 64.0 in | Wt 117.0 lb

## 2020-04-18 DIAGNOSIS — N83201 Unspecified ovarian cyst, right side: Secondary | ICD-10-CM

## 2020-04-18 NOTE — Telephone Encounter (Signed)
-----   Message from Malachy Mood, MD sent at 04/18/2020 10:27 AM EST ----- Regarding: Surgery Surgery Booking Request Patient Full Name:  AALIAH JORGENSON  MRN: [id number]  DOB: 09/08/1980  Surgeon: Malachy Mood, MD  Requested Surgery Date and Time: 04/23/2020 Primary Diagnosis AND Code: Right ovarian cyst Secondary Diagnosis and Code:  Surgical Procedure: Laparoscopic right ovarian cystectomy RNFA Requested?: No L&D Notification: No Admission Status: same day surgery Length of Surgery: 50 min Special Case Needs: Yes [facility] H&P: No (will do day of surgery) Phone Interview???:  Yes Interpreter: No Medical Clearance:  No Special Scheduling Instructions: No Any known health/anesthesia issues, diabetes, sleep apnea, latex allergy, defibrillator/pacemaker?: No Acuity: P2   (P1 highest, P2 delay may cause harm, P3 low, elective gyn, P4 lowest)

## 2020-04-18 NOTE — Progress Notes (Signed)
Obstetrics & Gynecology Office Visit   Chief Complaint: No chief complaint on file.   History of Present Illness: The patient is a 39 y.o. female presenting for emergency room follow up concerning a recently imaged right adnexal cyst.  Initial presentation was prompted by right lower quadrant pain.  Previous CT imaging demonstrated dimensions of 4.7cm x 3.6cm complex cyst in the right ovary thought to favor a ruptured hemorrhagic ovarian cyst.  TVUS  Revealed a 4.3 x 3.6 x 3.8cm right ovarian cyst containing fluid with some faint acoustic shadowing but several papillary projections.  Appearance was notable complex appearance, excrescences, normal doppler flow, no free fluid, no lymphadenopathy, no omental caking and absence of ascites. The patient endorses associated symptoms of abdominal pain.  The patient denies associated symptoms of  early satiety, weight gain, weight loss, pelvic pressure, constipation, diarrhea, nausea and emesis.  There is not a notable family history of ovarian cancer, uterine cancer, breast cancer, or colon cancer.  Review of Systems: 10 point review of systems negative unless otherwise noted in HPI  Past Medical History:  Patient Active Problem List   Diagnosis Date Noted  . S/P laparoscopic hysterectomy 04/14/2018  . Hydrosalpinx 02/02/2018  . Adenomyosis 02/02/2018  . Chronic pelvic pain in female 02/02/2018  . Abnormal uterine bleeding 02/02/2018  . Anxiety 08/17/2017  . Allergic rhinitis 08/17/2017  . Epigastric pain 05/12/2017  . Fatigue 05/12/2017  . Gastroesophageal reflux disease without esophagitis 05/12/2017  . Insomnia 05/12/2017  . Ankylosing spondylitis of multiple sites in spine ([place]) 02/23/2017  . Hyperlipidemia, mixed 06/12/2016  . DUB (dysfunctional uterine bleeding) 05/25/2016  . Long term current use of non-steroidal anti-inflammatories (NSAID) 05/25/2016  . 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 Chronic neck  pain 12/24/2015  . High risk medication use 12/24/2015  . HLA B27 (HLA B27 positive) 12/24/2015  . Migraine without status migrainosus, not intractable 12/24/2015  . Morning stiffness of joints 12/24/2015  . Numbness and tingling in both hands 12/24/2015  . Pain of both elbows 12/24/2015  . Risk for falls 12/24/2015  . 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 Poor concentration 03/22/2015    Past Surgical History:  Past Surgical History:  Procedure Laterality Date  . COLPOSCOPY    . CYSTOSCOPY N/A 04/14/2018   Procedure: CYSTOSCOPY;  Surgeon: Malachy Mood, MD;  Location: [facility] ORS;  Service: Gynecology;  Laterality: N/A;  . FINGER SURGERY  2014  . LAPAROSCOPIC HYSTERECTOMY N/A 04/14/2018   Procedure: HYSTERECTOMY TOTAL LAPAROSCOPIC BILATERAL SALPINGECTOMY;  Surgeon: Malachy Mood, MD;  Location: [facility] ORS;  Service: Gynecology;  Laterality: N/A;    Gynecologic History: No LMP recorded (lmp unknown). Patient has had a hysterectomy.  Obstetric History: [id number]  Family History:  Family History  Problem Relation Age of Onset  . Diabetes Mother   . Arthritis Father   . Diabetes Father        hypoglycemia  . Stroke Paternal Grandmother   . Heart disease Paternal Grandfather     Social History:  Social History   Socioeconomic History  . Marital status: Married    Spouse name: Not on file  . Number of children: Not on file  . Years of education: Not on file  . Highest education level: Not on file  Occupational History  . Not on file  Tobacco Use  . Smoking status: Former Smoker    Types: Cigarettes    Quit date: 04/06/2011    Years since quitting: 9.0  .  Smokeless tobacco: Never Used  . Tobacco comment: quit 2015  Vaping Use  . Vaping Use: Never used  Substance and Sexual Activity  . Alcohol use: Yes    Comment: occassionally  . Drug use: No  . Sexual activity: Yes    Birth control/protection: Injection  Other Topics Concern  . Not on file  Social History  Narrative  . Not on file   Social Determinants of Health   Financial Resource Strain: Not on file  Food Insecurity: Not on file  Transportation Needs: Not on file  Physical Activity: Not on file  Stress: Not on file  Social Connections: Not on file  Intimate Partner Violence: Not on file    Allergies:  Allergies  Allergen Reactions  . Sulfa Antibiotics Rash    Medications: Prior to Admission medications   Medication Sig Start Date End Date Taking? Authorizing Provider  Adalimumab (HUMIRA) 40 MG/0.8ML PSKT Inject 40 mg into the skin every 14 (fourteen) days.     [provider]  ALPRAZolam Duanne Moron) 0.5 MG tablet Take 0.5 mg by mouth 2 (two) times daily.    [provider]  Brexpiprazole (REXULTI) 1 MG TABS Take 1 mg by mouth daily.     [provider]  cephALEXin (KEFLEX) 500 MG capsule Take 1 capsule (500 mg total) by mouth 3 (three) times daily. Patient not taking: Reported on 04/01/2020 10/25/19   Versie Starks, PA-C  Cholecalciferol (VITAMIN D) 2000 units CAPS Take 2,000 Units by mouth daily.    [provider]  famotidine (PEPCID) 20 MG tablet Take 1 tablet (20 mg total) by mouth 2 (two) times daily for 7 days. 10/27/19 11/03/19  Menshew, Dannielle Karvonen, PA-C  mirtazapine (REMERON) 30 MG tablet Take 30 mg by mouth at bedtime.    [provider]  ondansetron (ZOFRAN ODT) 4 MG disintegrating tablet Take 1 tablet (4 mg total) by mouth every 8 (eight) hours as needed for nausea or vomiting. 04/11/20   Merlyn Lot, MD  oxyCODONE-acetaminophen (PERCOCET) 5-325 MG tablet Take 1 tablet by mouth every 4 (four) hours as needed for severe pain. 04/11/20 04/11/21  Merlyn Lot, MD  pantoprazole (PROTONIX) 40 MG tablet Take 1 tablet (40 mg total) by mouth daily. Patient taking differently: Take 40 mg by mouth 2 (two) times daily.  08/17/17   Juline Patch, MD  Vilazodone HCl (VIIBRYD) 40 MG TABS Take 40 mg by mouth daily. Patient not  taking: Reported on 04/01/2020    [provider]    Physical Exam Vitals: There were no vitals filed for this visit. No LMP recorded (lmp unknown). Patient has had a hysterectomy.  General: NAD, well nourished, appears stated age 12: normocephalic, anicteric Pulmonary: No increased work of breathing Neurologic: Grossly intact Psychiatric: mood appropriate, affect full  Female chaperone present for pelvic and breast  portions of the physical exam  CT ABDOMEN PELVIS W CONTRAST  Result Date: 04/11/2020 CLINICAL DATA:  39 year old female with right lower quadrant abdominal pain. EXAM: CT ABDOMEN AND PELVIS WITH CONTRAST TECHNIQUE: Multidetector CT imaging of the abdomen and pelvis was performed using the standard protocol following bolus administration of intravenous contrast. CONTRAST:  180m OMNIPAQUE IOHEXOL 300 MG/ML  SOLN COMPARISON:  None. FINDINGS: Lower chest: The visualized lung bases are clear. No intra-abdominal free air. Small free fluid in the pelvis. Hepatobiliary: No focal liver abnormality is seen. No gallstones, gallbladder wall thickening, or biliary dilatation. Pancreas: Unremarkable. No pancreatic ductal dilatation or surrounding inflammatory changes.  Spleen: Small scattered calcified splenic granuloma. The spleen is otherwise unremarkable. Adrenals/Urinary Tract: The adrenal glands unremarkable. The kidneys, visualized ureters, and urinary bladder appear unremarkable. Stomach/Bowel: There is no bowel obstruction or active inflammation. The appendix is normal. Vascular/Lymphatic: The abdominal aorta and IVC are unremarkable. No portal venous gas. There is no adenopathy. Reproductive: Hysterectomy. There is a complex lesion in the right ovary measuring 4.7 x 3.7 cm, likely hemorrhagic cyst. Further evaluation with pelvic ultrasound recommended. There are 2 dominant follicles or corpus luteum in the left ovary measuring up to 2 cm. Other: None Musculoskeletal: Mild L5-S1  anterolisthesis. No acute osseous pathology. IMPRESSION: 1. Complex lesion in the right ovary, likely ruptured hemorrhagic cyst. Further evaluation with pelvic ultrasound recommended. 2. No bowel obstruction. Normal appendix. Electronically Signed   By: Anner Crete M.D.   On: 04/11/2020 18:29   US PELVIC COMPLETE W TRANSVAGINAL AND TORSION R/O  Result Date: 04/11/2020 CLINICAL DATA:  Right lower quadrant abdominal pain EXAM: TRANSABDOMINAL AND TRANSVAGINAL ULTRASOUND OF PELVIS DOPPLER ULTRASOUND OF OVARIES TECHNIQUE: Both transabdominal and transvaginal ultrasound examinations of the pelvis were performed. Transabdominal technique was performed for global imaging of the pelvis including uterus, ovaries, adnexal regions, and pelvic cul-de-sac. It was necessary to proceed with endovaginal exam following the transabdominal exam to visualize the ovaries and adnexal structures. Color and duplex Doppler ultrasound was utilized to evaluate blood flow to the ovaries. COMPARISON:  None. FINDINGS: Uterus Status post hysterectomy Endometrium Not applicable. Right ovary Measurements: 5.7 x 3.8 x 4.9 cm = volume: 55 mL. There is a dominant cystic structure within the right which measures 4.3 x 3.6 x 3.8 cm. This has multiple (greater than 4) papillary projections which measure up to 9 mm. Left ovary Measurements: 3.4 x 2.6 x 2.6 cm = volume: 12 mL. Scattered, thick wall hypoechoic structures are noted which measure up to 1.9 cm. These are favored to represent follicles and corpus luteum cyst. Pulsed Doppler evaluation of both ovaries demonstrates normal low-resistance arterial and venous waveforms. Other findings There is a small volume of free fluid noted within the pelvis IMPRESSION: Dominant right ovary cyst with high risk features including multiple (greater than 4) papillary projections. Recommend follow-up with gynecologic oncologist for further workup. Small volume of free fluid noted within the pelvis,  nonspecific Electronically Signed   By: Kerby Moors M.D.   On: 04/11/2020 20:09   Immunization History  Administered Date(s) Administered  . Influenza,inj,Quad PF,6+ Mos 02/07/2016, 02/01/2017  . Influenza-Unspecified 02/09/2015, 02/07/2016, 02/01/2017, 03/12/2020  . Moderna SARS-COVID-2 Vaccination 03/22/2020  . PPD Test 09/10/2015, 09/10/2015, 11/22/2015  . Tdap 03/22/2015   Surgical Findings at time of hysterectomy 2019   Assessment: 39 y.o. [id number] presenting for complex right ovarian cyst  Plan: Problem List Items Addressed This Visit   None      1)  The incidence and implication of adnexal masses and ovarian cysts were discussed with the patient in detail.  Prior imaging if available was reviewed at today's visit..  The vast majority of these lesions will represent benign or physiologic processes and may well resolve on repeat imaging with expectant management.  We discussed that in a premenopausal patient not on ovulation suppression with via a systemic form hormonal contraception the normal function of the ovary during follicular development is the formation of a dominant follicle or cyst(s) every month.  This is an essential part of normal reproductive physiology.  In some cases these cysts can take on larger dimensions, hemorrhage, or undergo  torsion making them symptomatic. Torsion is relatively unlikely for lesions under 5 cm.  Based on initial imaging findings the overall concern for malignancy is deemed low.   - Patient given option of follow up imaging vs removal and desires removal.  Favor benign lesion given acute onset of symptoms and negative tumor markers.  I did discuss frozen pathology as patient would like results before Christmas.  The patient is status post prior bilteral salpingectomy but remnant tubal fimbria are also in the differential.    2) ROMA obtained in ER and low risk.  RMI also calculated low risk benign.  3) IOTA LR2 score 13.9%, LR1 score low risk  3.6%  4) No follow-ups on file.   Malachy Mood, MD, [facility], [facility] 04/18/2020, 10:04 AM

## 2020-04-18 NOTE — Progress Notes (Signed)
ER F/U for ovarian cyst. Still having cramping like her menstrual cycle. Procedure RM

## 2020-04-18 NOTE — Telephone Encounter (Signed)
Pt called with a question. She is supposed to receive her 2nd Covid vac tomorrow at 4:00 and wondered if she is ok to do that with surgery scheduled for Tuesday, 12/14. I adv pt that I would ask Staebler and let her know.  Laparoscopic right ovarian cystectomy scheduled w Georgianne Fick  DOS 12/14  H&P - will get on DOS  Covid testing 12/13 @ 8-10:30, [address], drive up and wear mask. Advised pt to quarantine until DOS.  Pre-admit phone call appointment 12/13 between [place] that pt may also receive calls from the hospital pharmacy (rec'd already) and pre-service center.  Confirmed pt has [employer] as primary insurance. No secondary insurance.

## 2020-04-18 NOTE — H&P (View-Only) (Signed)
Obstetrics & Gynecology Office Visit   Chief Complaint: No chief complaint on file.   History of Present Illness: The patient is a 39 y.o. female presenting for emergency room follow up concerning a recently imaged right adnexal cyst.  Initial presentation was prompted by right lower quadrant pain.  Previous CT imaging demonstrated dimensions of 4.7cm x 3.6cm complex cyst in the right ovary thought to favor a ruptured hemorrhagic ovarian cyst.  TVUS  Revealed a 4.3 x 3.6 x 3.8cm right ovarian cyst containing fluid with some faint acoustic shadowing but several papillary projections.  Appearance was notable complex appearance, excrescences, normal doppler flow, no free fluid, no lymphadenopathy, no omental caking and absence of ascites. The patient endorses associated symptoms of abdominal pain.  The patient denies associated symptoms of  early satiety, weight gain, weight loss, pelvic pressure, constipation, diarrhea, nausea and emesis.  There is not a notable family history of ovarian cancer, uterine cancer, breast cancer, or colon cancer.  Review of Systems: 10 point review of systems negative unless otherwise noted in HPI  Past Medical History:  Patient Active Problem List   Diagnosis Date Noted  . S/P laparoscopic hysterectomy 04/14/2018  . Hydrosalpinx 02/02/2018  . Adenomyosis 02/02/2018  . Chronic pelvic pain in female 02/02/2018  . Abnormal uterine bleeding 02/02/2018  . Anxiety 08/17/2017  . Allergic rhinitis 08/17/2017  . Epigastric pain 05/12/2017  . Fatigue 05/12/2017  . Gastroesophageal reflux disease without esophagitis 05/12/2017  . Insomnia 05/12/2017  . Ankylosing spondylitis of multiple sites in spine ([place]) 02/23/2017  . Hyperlipidemia, mixed 06/12/2016  . DUB (dysfunctional uterine bleeding) 05/25/2016  . Long term current use of non-steroidal anti-inflammatories (NSAID) 05/25/2016  . 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 Chronic neck  pain 12/24/2015  . High risk medication use 12/24/2015  . HLA B27 (HLA B27 positive) 12/24/2015  . Migraine without status migrainosus, not intractable 12/24/2015  . Morning stiffness of joints 12/24/2015  . Numbness and tingling in both hands 12/24/2015  . Pain of both elbows 12/24/2015  . Risk for falls 12/24/2015  . 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 Poor concentration 03/22/2015    Past Surgical History:  Past Surgical History:  Procedure Laterality Date  . COLPOSCOPY    . CYSTOSCOPY N/A 04/14/2018   Procedure: CYSTOSCOPY;  Surgeon: Malachy Mood, MD;  Location: [facility] ORS;  Service: Gynecology;  Laterality: N/A;  . FINGER SURGERY  2014  . LAPAROSCOPIC HYSTERECTOMY N/A 04/14/2018   Procedure: HYSTERECTOMY TOTAL LAPAROSCOPIC BILATERAL SALPINGECTOMY;  Surgeon: Malachy Mood, MD;  Location: [facility] ORS;  Service: Gynecology;  Laterality: N/A;    Gynecologic History: No LMP recorded (lmp unknown). Patient has had a hysterectomy.  Obstetric History: [id number]  Family History:  Family History  Problem Relation Age of Onset  . Diabetes Mother   . Arthritis Father   . Diabetes Father        hypoglycemia  . Stroke Paternal Grandmother   . Heart disease Paternal Grandfather     Social History:  Social History   Socioeconomic History  . Marital status: Married    Spouse name: Not on file  . Number of children: Not on file  . Years of education: Not on file  . Highest education level: Not on file  Occupational History  . Not on file  Tobacco Use  . Smoking status: Former Smoker    Types: Cigarettes    Quit date: 04/06/2011    Years since quitting: 9.0  .  Smokeless tobacco: Never Used  . Tobacco comment: quit 2015  Vaping Use  . Vaping Use: Never used  Substance and Sexual Activity  . Alcohol use: Yes    Comment: occassionally  . Drug use: No  . Sexual activity: Yes    Birth control/protection: Injection  Other Topics Concern  . Not on file  Social History  Narrative  . Not on file   Social Determinants of Health   Financial Resource Strain: Not on file  Food Insecurity: Not on file  Transportation Needs: Not on file  Physical Activity: Not on file  Stress: Not on file  Social Connections: Not on file  Intimate Partner Violence: Not on file    Allergies:  Allergies  Allergen Reactions  . Sulfa Antibiotics Rash    Medications: Prior to Admission medications   Medication Sig Start Date End Date Taking? Authorizing Provider  Adalimumab (HUMIRA) 40 MG/0.8ML PSKT Inject 40 mg into the skin every 14 (fourteen) days.     [provider]  ALPRAZolam Duanne Moron) 0.5 MG tablet Take 0.5 mg by mouth 2 (two) times daily.    [provider]  Brexpiprazole (REXULTI) 1 MG TABS Take 1 mg by mouth daily.     [provider]  cephALEXin (KEFLEX) 500 MG capsule Take 1 capsule (500 mg total) by mouth 3 (three) times daily. Patient not taking: Reported on 04/01/2020 10/25/19   Versie Starks, PA-C  Cholecalciferol (VITAMIN D) 2000 units CAPS Take 2,000 Units by mouth daily.    [provider]  famotidine (PEPCID) 20 MG tablet Take 1 tablet (20 mg total) by mouth 2 (two) times daily for 7 days. 10/27/19 11/03/19  Menshew, Dannielle Karvonen, PA-C  mirtazapine (REMERON) 30 MG tablet Take 30 mg by mouth at bedtime.    [provider]  ondansetron (ZOFRAN ODT) 4 MG disintegrating tablet Take 1 tablet (4 mg total) by mouth every 8 (eight) hours as needed for nausea or vomiting. 04/11/20   Merlyn Lot, MD  oxyCODONE-acetaminophen (PERCOCET) 5-325 MG tablet Take 1 tablet by mouth every 4 (four) hours as needed for severe pain. 04/11/20 04/11/21  Merlyn Lot, MD  pantoprazole (PROTONIX) 40 MG tablet Take 1 tablet (40 mg total) by mouth daily. Patient taking differently: Take 40 mg by mouth 2 (two) times daily.  08/17/17   Juline Patch, MD  Vilazodone HCl (VIIBRYD) 40 MG TABS Take 40 mg by mouth daily. Patient not  taking: Reported on 04/01/2020    [provider]    Physical Exam Vitals: There were no vitals filed for this visit. No LMP recorded (lmp unknown). Patient has had a hysterectomy.  General: NAD, well nourished, appears stated age 12: normocephalic, anicteric Pulmonary: No increased work of breathing Neurologic: Grossly intact Psychiatric: mood appropriate, affect full  Female chaperone present for pelvic and breast  portions of the physical exam  CT ABDOMEN PELVIS W CONTRAST  Result Date: 04/11/2020 CLINICAL DATA:  39 year old female with right lower quadrant abdominal pain. EXAM: CT ABDOMEN AND PELVIS WITH CONTRAST TECHNIQUE: Multidetector CT imaging of the abdomen and pelvis was performed using the standard protocol following bolus administration of intravenous contrast. CONTRAST:  180m OMNIPAQUE IOHEXOL 300 MG/ML  SOLN COMPARISON:  None. FINDINGS: Lower chest: The visualized lung bases are clear. No intra-abdominal free air. Small free fluid in the pelvis. Hepatobiliary: No focal liver abnormality is seen. No gallstones, gallbladder wall thickening, or biliary dilatation. Pancreas: Unremarkable. No pancreatic ductal dilatation or surrounding inflammatory changes.  Spleen: Small scattered calcified splenic granuloma. The spleen is otherwise unremarkable. Adrenals/Urinary Tract: The adrenal glands unremarkable. The kidneys, visualized ureters, and urinary bladder appear unremarkable. Stomach/Bowel: There is no bowel obstruction or active inflammation. The appendix is normal. Vascular/Lymphatic: The abdominal aorta and IVC are unremarkable. No portal venous gas. There is no adenopathy. Reproductive: Hysterectomy. There is a complex lesion in the right ovary measuring 4.7 x 3.7 cm, likely hemorrhagic cyst. Further evaluation with pelvic ultrasound recommended. There are 2 dominant follicles or corpus luteum in the left ovary measuring up to 2 cm. Other: None Musculoskeletal: Mild L5-S1  anterolisthesis. No acute osseous pathology. IMPRESSION: 1. Complex lesion in the right ovary, likely ruptured hemorrhagic cyst. Further evaluation with pelvic ultrasound recommended. 2. No bowel obstruction. Normal appendix. Electronically Signed   By: Anner Crete M.D.   On: 04/11/2020 18:29   US PELVIC COMPLETE W TRANSVAGINAL AND TORSION R/O  Result Date: 04/11/2020 CLINICAL DATA:  Right lower quadrant abdominal pain EXAM: TRANSABDOMINAL AND TRANSVAGINAL ULTRASOUND OF PELVIS DOPPLER ULTRASOUND OF OVARIES TECHNIQUE: Both transabdominal and transvaginal ultrasound examinations of the pelvis were performed. Transabdominal technique was performed for global imaging of the pelvis including uterus, ovaries, adnexal regions, and pelvic cul-de-sac. It was necessary to proceed with endovaginal exam following the transabdominal exam to visualize the ovaries and adnexal structures. Color and duplex Doppler ultrasound was utilized to evaluate blood flow to the ovaries. COMPARISON:  None. FINDINGS: Uterus Status post hysterectomy Endometrium Not applicable. Right ovary Measurements: 5.7 x 3.8 x 4.9 cm = volume: 55 mL. There is a dominant cystic structure within the right which measures 4.3 x 3.6 x 3.8 cm. This has multiple (greater than 4) papillary projections which measure up to 9 mm. Left ovary Measurements: 3.4 x 2.6 x 2.6 cm = volume: 12 mL. Scattered, thick wall hypoechoic structures are noted which measure up to 1.9 cm. These are favored to represent follicles and corpus luteum cyst. Pulsed Doppler evaluation of both ovaries demonstrates normal low-resistance arterial and venous waveforms. Other findings There is a small volume of free fluid noted within the pelvis IMPRESSION: Dominant right ovary cyst with high risk features including multiple (greater than 4) papillary projections. Recommend follow-up with gynecologic oncologist for further workup. Small volume of free fluid noted within the pelvis,  nonspecific Electronically Signed   By: Kerby Moors M.D.   On: 04/11/2020 20:09   Immunization History  Administered Date(s) Administered  . Influenza,inj,Quad PF,6+ Mos 02/07/2016, 02/01/2017  . Influenza-Unspecified 02/09/2015, 02/07/2016, 02/01/2017, 03/12/2020  . Moderna SARS-COVID-2 Vaccination 03/22/2020  . PPD Test 09/10/2015, 09/10/2015, 11/22/2015  . Tdap 03/22/2015   Surgical Findings at time of hysterectomy 2019   Assessment: 39 y.o. [id number] presenting for complex right ovarian cyst  Plan: Problem List Items Addressed This Visit   None      1)  The incidence and implication of adnexal masses and ovarian cysts were discussed with the patient in detail.  Prior imaging if available was reviewed at today's visit..  The vast majority of these lesions will represent benign or physiologic processes and may well resolve on repeat imaging with expectant management.  We discussed that in a premenopausal patient not on ovulation suppression with via a systemic form hormonal contraception the normal function of the ovary during follicular development is the formation of a dominant follicle or cyst(s) every month.  This is an essential part of normal reproductive physiology.  In some cases these cysts can take on larger dimensions, hemorrhage, or undergo  torsion making them symptomatic. Torsion is relatively unlikely for lesions under 5 cm.  Based on initial imaging findings the overall concern for malignancy is deemed low.   - Patient given option of follow up imaging vs removal and desires removal.  Favor benign lesion given acute onset of symptoms and negative tumor markers.  I did discuss frozen pathology as patient would like results before Christmas.  The patient is status post prior bilteral salpingectomy but remnant tubal fimbria are also in the differential.    2) ROMA obtained in ER and low risk.  RMI also calculated low risk benign.  3) IOTA LR2 score 13.9%, LR1 score low risk  3.6%  4) No follow-ups on file.   Malachy Mood, MD, [facility], [facility] 04/18/2020, 10:04 AM

## 2020-04-19 ENCOUNTER — Ambulatory Visit
Admission: RE | Admit: 2020-04-19 | Discharge: 2020-04-19 | Disposition: A | Discharge status: Home / Self Care | Payer: PRIVATE HEALTH INSURANCE | Source: Ambulatory Visit | Attending: Obstetrics and Gynecology | Admitting: Obstetrics and Gynecology

## 2020-04-19 ENCOUNTER — Ambulatory Visit
Admission: RE | Admit: 2020-04-19 | Discharge: 2020-04-19 | Disposition: A | Discharge status: Home / Self Care | Payer: No Typology Code available for payment source | Source: Ambulatory Visit | Attending: Obstetrics and Gynecology | Admitting: Obstetrics and Gynecology

## 2020-04-19 DIAGNOSIS — N644 Mastodynia: Secondary | ICD-10-CM

## 2020-04-19 DIAGNOSIS — Z1239 Encounter for other screening for malignant neoplasm of breast: Secondary | ICD-10-CM

## 2020-04-22 ENCOUNTER — Other Ambulatory Visit: Payer: Self-pay

## 2020-04-22 ENCOUNTER — Encounter
Admission: RE | Admit: 2020-04-22 | Discharge: 2020-04-22 | Disposition: A | Discharge status: Home / Self Care | Payer: PRIVATE HEALTH INSURANCE | Source: Ambulatory Visit | Attending: Obstetrics and Gynecology | Admitting: Obstetrics and Gynecology

## 2020-04-22 ENCOUNTER — Other Ambulatory Visit
Admission: RE | Admit: 2020-04-22 | Discharge: 2020-04-22 | Disposition: A | Discharge status: Home / Self Care | Payer: PRIVATE HEALTH INSURANCE | Source: Ambulatory Visit | Attending: Obstetrics and Gynecology | Admitting: Obstetrics and Gynecology

## 2020-04-22 DIAGNOSIS — Z01812 Encounter for preprocedural laboratory examination: Secondary | ICD-10-CM | POA: Insufficient documentation

## 2020-04-22 DIAGNOSIS — Z20822 Contact with and (suspected) exposure to covid-19: Secondary | ICD-10-CM | POA: Insufficient documentation

## 2020-04-22 LAB — SARS CORONAVIRUS 2 (TAT 6-24 HRS): SARS Coronavirus 2: NEGATIVE

## 2020-04-22 NOTE — Pre-Procedure Instructions (Signed)
Patient arrived for Covid screen prior to phone interview. Did not receive CHG or Carb drink. Gave verbal pre op instructions, also noted these instructions would be in her MY Chart account for viewing. Patient verbalized understanding.

## 2020-04-22 NOTE — Patient Instructions (Addendum)
Your procedure is scheduled on: 04/23/20 Report to [facility]. YOU MUST CHECK IN AT [facility]. To find out your arrival time please call [phone] between 1PM - 3PM on 04/22/20. THE SECRETARY WILL CALL YOU AFTER 1 PM  Remember: Instructions that are not followed completely may result in serious medical risk, up to and including death, or upon the discretion of your surgeon and anesthesiologist your surgery may need to be rescheduled.     _X__ 1. Do not eat food after midnight the night before your procedure.                 No gum chewing or hard candies. You may drink clear liquids up to 2 hours                 before you are scheduled to arrive for your surgery- DO not drink clear                 liquids within 2 hours of the start of your surgery.                 Clear Liquids include:  water, apple juice without pulp, clear carbohydrate                 drink such as Clearfast or Gatorade, Black Coffee or Tea (Do not add                 anything to coffee or tea). Diabetics water only  __X__2.  On the morning of surgery brush your teeth with toothpaste and water, you                 may rinse your mouth with mouthwash if you wish.  Do not swallow any              toothpaste of mouthwash.     _X__ 3.  No Alcohol for 24 hours before or after surgery.   _X__ 4.  Do Not Smoke or use e-cigarettes For 24 Hours Prior to Your Surgery.                 Do not use any chewable tobacco products for at least 6 hours prior to                 surgery.  ____  5.  Bring all medications with you on the day of surgery if instructed.   __X__  6.  Notify your doctor if there is any change in your medical condition      (cold, fever, infections).     Do not wear jewelry, make-up, hairpins, clips or nail polish. Do not wear lotions, powders, or perfumes.  Do not shave 48 hours prior to surgery. Men may shave face and neck. Do not bring  valuables to the hospital.    [facility] is not responsible for any belongings or valuables.  Contacts, dentures/partials or body piercings may not be worn into surgery. Bring a case for your contacts, glasses or hearing aids, a denture cup will be supplied. Leave your suitcase in the car. After surgery it may be brought to your room. For patients admitted to the hospital, discharge time is determined by your treatment team.   Patients discharged the day of surgery will not be allowed to drive home.   Please read over the following fact sheets that you were given:  __X__ Take these medicines the morning of surgery with A SIP OF WATER:    1. pantoprazole (PROTONIX) 40 MG tablet  2. Vilazodone HCl 20 MG TABS  3.   4.  5.  6.  ____ Fleet Enema (as directed)   ____ Use CHG Soap/SAGE wipes as directed  ____ Use inhalers on the day of surgery  ____ Stop metformin/Janumet/Farxiga 2 days prior to surgery    ____ Take 1/2 of usual insulin dose the night before surgery. No insulin the morning          of surgery.   ____ Stop Blood Thinners Coumadin/Plavix/Xarelto/Pleta/Pradaxa/Eliquis/Effient/Aspirin  on   Or contact your Surgeon, Cardiologist or Medical Doctor regarding  ability to stop your blood thinners  __X__ Stop Anti-inflammatories 7 days before surgery such as Advil, Ibuprofen, Motrin,  BC or Goodies Powder, Naprosyn, Naproxen, Aleve, Aspirin    __X__ Stop all herbal supplements, fish oil or vitamin E until after surgery.    ____ Bring C-Pap to the hospital.

## 2020-04-23 ENCOUNTER — Encounter: Payer: Self-pay | Admitting: Obstetrics and Gynecology

## 2020-04-23 ENCOUNTER — Ambulatory Visit: Payer: No Typology Code available for payment source | Admitting: Anesthesiology

## 2020-04-23 ENCOUNTER — Encounter
Admission: RE | Disposition: A | Discharge status: Home / Self Care | Payer: Self-pay | Source: Home / Self Care | Attending: Obstetrics and Gynecology

## 2020-04-23 ENCOUNTER — Ambulatory Visit
Admission: RE | Admit: 2020-04-23 | Discharge: 2020-04-23 | Disposition: A | Discharge status: Home / Self Care | Payer: No Typology Code available for payment source | Attending: Obstetrics and Gynecology | Admitting: Obstetrics and Gynecology

## 2020-04-23 DIAGNOSIS — Z79899 Other long term (current) drug therapy: Secondary | ICD-10-CM | POA: Insufficient documentation

## 2020-04-23 DIAGNOSIS — D270 Benign neoplasm of right ovary: Secondary | ICD-10-CM

## 2020-04-23 DIAGNOSIS — N83291 Other ovarian cyst, right side: Secondary | ICD-10-CM | POA: Diagnosis present

## 2020-04-23 DIAGNOSIS — Z87891 Personal history of nicotine dependence: Secondary | ICD-10-CM | POA: Insufficient documentation

## 2020-04-23 DIAGNOSIS — Z9889 Other specified postprocedural states: Secondary | ICD-10-CM

## 2020-04-23 LAB — POCT PREGNANCY, URINE: Preg Test, Ur: NEGATIVE

## 2020-04-23 SURGERY — OOPHORECTOMY, LAPAROSCOPIC
Anesthesia: General | Site: Abdomen | Laterality: Right

## 2020-04-23 MED ORDER — IBUPROFEN 800 MG PO TABS: 800.0000 mg | ORAL_TABLET | Freq: Three times a day (TID) | ORAL | 0 refills | Status: DC | PRN

## 2020-04-23 MED ORDER — LACTATED RINGERS IV SOLN: INTRAVENOUS | Status: DC

## 2020-04-23 MED ORDER — LIDOCAINE HCL (PF) 2 % IJ SOLN
INTRAMUSCULAR | Status: AC
  Filled 2020-04-23: qty 5

## 2020-04-23 MED ORDER — BUPIVACAINE HCL 0.5 % IJ SOLN
INTRAMUSCULAR | Status: DC | PRN
  Administered 2020-04-23: 7 mL

## 2020-04-23 MED ORDER — FENTANYL CITRATE (PF) 100 MCG/2ML IJ SOLN
INTRAMUSCULAR | Status: DC | PRN
  Administered 2020-04-23: 50 ug via INTRAVENOUS
  Administered 2020-04-23: 100 ug via INTRAVENOUS

## 2020-04-23 MED ORDER — CEFAZOLIN SODIUM-DEXTROSE 2-4 GM/100ML-% IV SOLN
INTRAVENOUS | Status: AC
  Filled 2020-04-23: qty 100

## 2020-04-23 MED ORDER — ROCURONIUM BROMIDE 100 MG/10ML IV SOLN
INTRAVENOUS | Status: DC | PRN
  Administered 2020-04-23: 30 mg via INTRAVENOUS

## 2020-04-23 MED ORDER — MIDAZOLAM HCL 2 MG/2ML IJ SOLN
INTRAMUSCULAR | Status: DC | PRN
  Administered 2020-04-23: 2 mg via INTRAVENOUS

## 2020-04-23 MED ORDER — OXYCODONE-ACETAMINOPHEN 5-325 MG PO TABS: 1.0000 | ORAL_TABLET | ORAL | 0 refills | Status: DC | PRN

## 2020-04-23 MED ORDER — LIDOCAINE 2% (20 MG/ML) 5 ML SYRINGE
INTRAMUSCULAR | Status: DC | PRN
  Administered 2020-04-23: 60 mg via INTRAVENOUS

## 2020-04-23 MED ORDER — ONDANSETRON HCL 4 MG/2ML IJ SOLN
INTRAMUSCULAR | Status: AC
  Filled 2020-04-23: qty 2

## 2020-04-23 MED ORDER — OXYCODONE-ACETAMINOPHEN 5-325 MG PO TABS: 1.0000 | ORAL_TABLET | ORAL | Status: DC | PRN

## 2020-04-23 MED ORDER — ORAL CARE MOUTH RINSE
15.0000 mL | Freq: Once | OROMUCOSAL | Status: AC
  Administered 2020-04-23: 07:00:00 15 mL via OROMUCOSAL

## 2020-04-23 MED ORDER — FENTANYL CITRATE (PF) 100 MCG/2ML IJ SOLN
INTRAMUSCULAR | Status: AC
  Filled 2020-04-23: qty 2

## 2020-04-23 MED ORDER — FENTANYL CITRATE (PF) 250 MCG/5ML IJ SOLN
INTRAMUSCULAR | Status: AC
  Filled 2020-04-23: qty 5

## 2020-04-23 MED ORDER — FENTANYL CITRATE (PF) 100 MCG/2ML IJ SOLN
25.0000 ug | INTRAMUSCULAR | Status: DC | PRN
Start: 2020-04-23 — End: 2020-04-23
  Administered 2020-04-23: 25 ug via INTRAVENOUS

## 2020-04-23 MED ORDER — MIDAZOLAM HCL 2 MG/2ML IJ SOLN
INTRAMUSCULAR | Status: AC
  Filled 2020-04-23: qty 2

## 2020-04-23 MED ORDER — PROPOFOL 10 MG/ML IV BOLUS
INTRAVENOUS | Status: DC | PRN
  Administered 2020-04-23: 120 mg via INTRAVENOUS

## 2020-04-23 MED ORDER — DEXMEDETOMIDINE HCL 200 MCG/2ML IV SOLN
INTRAVENOUS | Status: DC | PRN
  Administered 2020-04-23: 8 ug via INTRAVENOUS
  Administered 2020-04-23: 12 ug via INTRAVENOUS

## 2020-04-23 MED ORDER — CHLORHEXIDINE GLUCONATE 0.12 % MT SOLN
OROMUCOSAL | Status: AC
  Filled 2020-04-23: qty 15

## 2020-04-23 MED ORDER — PROPOFOL 10 MG/ML IV BOLUS
INTRAVENOUS | Status: AC
  Filled 2020-04-23: qty 20

## 2020-04-23 MED ORDER — DEXAMETHASONE SODIUM PHOSPHATE 10 MG/ML IJ SOLN
INTRAMUSCULAR | Status: DC | PRN
  Administered 2020-04-23: 10 mg via INTRAVENOUS

## 2020-04-23 MED ORDER — ONDANSETRON HCL 4 MG/2ML IJ SOLN
INTRAMUSCULAR | Status: DC | PRN
  Administered 2020-04-23: 4 mg via INTRAVENOUS

## 2020-04-23 MED ORDER — KETOROLAC TROMETHAMINE 30 MG/ML IJ SOLN
INTRAMUSCULAR | Status: DC | PRN
  Administered 2020-04-23: 30 mg via INTRAVENOUS

## 2020-04-23 MED ORDER — POVIDONE-IODINE 10 % EX SWAB
2.0000 "application " | Freq: Once | CUTANEOUS | Status: AC
  Administered 2020-04-23: 2 via TOPICAL

## 2020-04-23 MED ORDER — SUGAMMADEX SODIUM 200 MG/2ML IV SOLN
INTRAVENOUS | Status: DC | PRN
  Administered 2020-04-23: 200 mg via INTRAVENOUS

## 2020-04-23 MED ORDER — DEXAMETHASONE SODIUM PHOSPHATE 10 MG/ML IJ SOLN
INTRAMUSCULAR | Status: AC
  Filled 2020-04-23: qty 1

## 2020-04-23 MED ORDER — CHLORHEXIDINE GLUCONATE 0.12 % MT SOLN: 15.0000 mL | Freq: Once | OROMUCOSAL | Status: AC

## 2020-04-23 MED ORDER — OXYCODONE-ACETAMINOPHEN 5-325 MG PO TABS
ORAL_TABLET | ORAL | Status: AC
  Administered 2020-04-23: 11:00:00 1
  Filled 2020-04-23: qty 1

## 2020-04-23 MED ORDER — CEFAZOLIN SODIUM-DEXTROSE 2-4 GM/100ML-% IV SOLN
2.0000 g | INTRAVENOUS | Status: AC
  Administered 2020-04-23: 08:00:00 2 g via INTRAVENOUS

## 2020-04-23 MED ORDER — BUPIVACAINE HCL (PF) 0.5 % IJ SOLN
INTRAMUSCULAR | Status: AC
  Filled 2020-04-23: qty 30

## 2020-04-23 MED ORDER — ONDANSETRON HCL 4 MG/2ML IJ SOLN: 4.0000 mg | Freq: Once | INTRAMUSCULAR | Status: DC | PRN

## 2020-04-23 SURGICAL SUPPLY — 49 items
ADH SKN CLS APL DERMABOND .7 (GAUZE/BANDAGES/DRESSINGS) ×4
ANCHOR TIS RET SYS 235ML (MISCELLANEOUS) ×3 IMPLANT
APL PRP STRL LF DISP 70% ISPRP (MISCELLANEOUS) ×2
BAG DRN RND TRDRP ANRFLXCHMBR (UROLOGICAL SUPPLIES) ×2
BAG TISS RTRVL C235 10X14 (MISCELLANEOUS) ×2
BAG URINE DRAIN 2000ML AR STRL (UROLOGICAL SUPPLIES) ×3 IMPLANT
BLADE SURG SZ11 CARB STEEL (BLADE) ×3 IMPLANT
CANISTER SUCT 1200ML W/VALVE (MISCELLANEOUS) IMPLANT
CATH FOLEY 2WAY  5CC 16FR (CATHETERS) ×1
CATH FOLEY 2WAY 5CC 16FR (CATHETERS) ×2
CATH URTH 16FR FL 2W BLN LF (CATHETERS) ×2 IMPLANT
CHLORAPREP W/TINT 26 (MISCELLANEOUS) ×3 IMPLANT
CNTNR SPEC 2.5X3XGRAD LEK (MISCELLANEOUS) ×2
CONT SPEC 4OZ STER OR WHT (MISCELLANEOUS) ×1
CONT SPEC 4OZ STRL OR WHT (MISCELLANEOUS) ×2
CONTAINER SPEC 2.5X3XGRAD LEK (MISCELLANEOUS) ×2 IMPLANT
COVER WAND RF STERILE (DRAPES) ×3 IMPLANT
DERMABOND ADVANCED (GAUZE/BANDAGES/DRESSINGS) ×2
DERMABOND ADVANCED .7 DNX12 (GAUZE/BANDAGES/DRESSINGS) ×4 IMPLANT
DRSG TEGADERM 2-3/8X2-3/4 SM (GAUZE/BANDAGES/DRESSINGS) ×6 IMPLANT
GLOVE BIO SURGEON STRL SZ7 (GLOVE) ×3 IMPLANT
GLOVE INDICATOR 7.5 STRL GRN (GLOVE) ×3 IMPLANT
GOWN STRL REUS W/ TWL LRG LVL3 (GOWN DISPOSABLE) ×4 IMPLANT
GOWN STRL REUS W/ TWL XL LVL3 (GOWN DISPOSABLE) IMPLANT
GOWN STRL REUS W/TWL LRG LVL3 (GOWN DISPOSABLE) ×6
GOWN STRL REUS W/TWL XL LVL3 (GOWN DISPOSABLE)
GRASPER SUT TROCAR 14GX15 (MISCELLANEOUS) ×3 IMPLANT
IRRIGATION STRYKERFLOW (MISCELLANEOUS) IMPLANT
IRRIGATOR STRYKERFLOW (MISCELLANEOUS)
IV LACTATED RINGERS 1000ML (IV SOLUTION) ×3 IMPLANT
KIT PINK PAD W/HEAD ARE REST (MISCELLANEOUS) ×3
KIT PINK PAD W/HEAD ARM REST (MISCELLANEOUS) ×2 IMPLANT
KIT TURNOVER CYSTO (KITS) ×3 IMPLANT
LABEL OR SOLS (LABEL) ×3 IMPLANT
MANIFOLD NEPTUNE II (INSTRUMENTS) ×3 IMPLANT
NS IRRIG 500ML POUR BTL (IV SOLUTION) ×3 IMPLANT
PACK GYN LAPAROSCOPIC (MISCELLANEOUS) ×3 IMPLANT
PAD OB MATERNITY 4.3X12.25 (PERSONAL CARE ITEMS) ×3 IMPLANT
PAD PREP 24X41 OB/GYN DISP (PERSONAL CARE ITEMS) ×3 IMPLANT
PENCIL ELECTRO HAND CTR (MISCELLANEOUS) ×3 IMPLANT
SCISSORS METZENBAUM CVD 33 (INSTRUMENTS) ×3 IMPLANT
SET TUBE SMOKE EVAC HIGH FLOW (TUBING) ×3 IMPLANT
SHEARS HARMONIC ACE PLUS 36CM (ENDOMECHANICALS) ×3 IMPLANT
SLEEVE ENDOPATH XCEL 5M (ENDOMECHANICALS) ×6 IMPLANT
SUT MNCRL AB 4-0 PS2 18 (SUTURE) ×3 IMPLANT
SUT VIC AB 2-0 UR6 27 (SUTURE) ×3 IMPLANT
SUT VICRYL 0 AB UR-6 (SUTURE) ×3 IMPLANT
TROCAR ENDO BLADELESS 11MM (ENDOMECHANICALS) ×3 IMPLANT
TROCAR XCEL NON-BLD 5MMX100MML (ENDOMECHANICALS) ×3 IMPLANT

## 2020-04-23 NOTE — Discharge Instructions (Signed)
Excuse from Work, [employer], or Physical Activity _______________________________________________________ needs to be excused from: __x__ Work. ____ School. __x__ Physical activity. This is effective for the following dates: ____Had surgery on 12/14 will see Dr. Georgianne Fick for post op visit on __12/20 for further instructions________. He or she may return to work or school, but should avoid physical activity or other activities from now until _______________. Activity restrictions include: ___x_ Lifting more than _10________ lb. ____ Sitting longer than __________ minutes at a time. ____ Standing longer than ________ minutes at a time. ____ Other activities including: ___________________________________________________________________________________ ____ He or she may return to full physical activity on ________________. Health care provider name (printed): __Dr. A Staebler___________________________________________________ Health care provider (signature): _V/O Dr.Staebler/ Henrine Screws RN________________________________________________________ Date: ____12/14/21__________________________ This information is not intended to replace advice given to you by your health care provider. Make sure you discuss any questions you have with your health care provider. Document Revised: 04/22/2017 Document Reviewed: 04/22/2017 Elsevier Patient Education  2020 [place]   1) The drugs that you were given will stay in your system until tomorrow so for the next 24 hours you should not:  A) Drive an automobile B) Make any legal decisions C) Drink any alcoholic beverage   2) You may resume regular meals tomorrow.  Today it is better to start with liquids and gradually work up to solid foods.  You may eat anything you prefer, but it is better to start with liquids, then soup and crackers, and gradually work up to solid foods.   3) Please notify your doctor  immediately if you have any unusual bleeding, trouble breathing, redness and pain at the surgery site, drainage, fever, or pain not relieved by medication.    4) Additional Instructions:   Unilateral Salpingo-Oophorectomy, Care After This sheet gives you information about how to care for yourself after your procedure. Your health care provider may also give you more specific instructions. If you have problems or questions, contact your health care provider. What can I expect after the procedure? After the procedure, it is common to have:  Abdominal pain.  Some occasional vaginal bleeding (spotting).  Tiredness. Follow these instructions at home: Incision care   Keep your incision area and your bandage (dressing) clean and dry.  Follow instructions from your health care provider about how to take care of your incision. Make sure you: ? Wash your hands with soap and water before you change your dressing. If soap and water are not available, use hand sanitizer. ? Change your dressing as told by your health care provider. ? Leave stitches (sutures), staples, skin glue, or adhesive strips in place. These skin closures may need to stay in place for 2 weeks or longer. If adhesive strip edges start to loosen and curl up, you may trim the loose edges. Do not remove adhesive strips completely unless your health care provider tells you to do that.  Check your incision area every day for signs of infection. Check for: ? Redness, swelling, or pain. ? Fluid or blood. ? Warmth. ? Pus or a bad smell. Activity  Do not drive or use heavy machinery while taking prescription pain medicine.  Do not drive for 24 hours if you received a medicine to help you relax (sedative).  Take frequent, short walks throughout the day. Rest when you get tired. Ask your health care provider what activities are safe for you.  Avoid activities that require great effort. Also, avoid heavy lifting. Do  not lift  anything that is heavier than 5 lb (2.3 kg), or the limit that your health care provider tells you, until he or she says that it is safe to do so.  Do not douche, use tampons, or have sex until your health care provider approves. General instructions  To prevent or treat constipation while you are taking prescription pain medicine, your health care provider may recommend that you: ? Drink enough fluid to keep your urine pale yellow. ? Take over-the-counter or prescription medicines. ? Eat foods that are high in fiber, such as fresh fruits and vegetables, whole grains, and beans. ? Limit foods that are high in fat and processed sugars, such as fried and sweet foods.  Take over-the-counter and prescription medicines only as told by your health care provider.  Do not take baths, swim, or use a hot tub until your health care provider approves. Ask your health care provider if you may take showers. You may only be allowed to take sponge baths.  Wear compression stockings as told by your health care provider. These stockings help to prevent blood clots and reduce swelling in your legs.  Keep all follow-up visits as told by your health care provider. This is important. Contact a health care provider if:  You have pain when you urinate.  You have pus or a bad smelling discharge coming from your vagina.  You have redness, swelling, or pain around your incision.  You have fluid or blood coming from your incision.  Your incision feels warm to the touch.  You have pus or a bad smell coming from your incision.  You have a fever.  Your incision starts to break open.  You have abdominal pain that gets worse or does not get better with medicine.  You develop a rash.  You develop nausea and vomiting.  You feel lightheaded. Get help right away if:  You develop pain in your chest or leg.  You develop shortness of breath.  You faint.  You have increased bleeding from your  vagina. Summary  After the procedure, it is common to have pain, tiredness, and occasional bleeding from the vagina.  Follow instructions from your health care provider about how to take care of your incision.  Check your incision every day for signs of infection and report any symptoms to your health care provider.  Follow instructions from your health care provider about activities and restrictions. This information is not intended to replace advice given to you by your health care provider. Make sure you discuss any questions you have with your health care provider. Document Revised: 04/09/2017 Document Reviewed: 08/06/2016 Elsevier Patient Education  [place].  Please contact your physician with any problems or Same Day Surgery at [phone], Monday through Friday 6 am to 4 pm, or [place] at [facility] number at [phone].

## 2020-04-23 NOTE — Transfer of Care (Signed)
Immediate Anesthesia Transfer of Care Note  Patient: Lisa Peters  Procedure(s) Performed: LAPAROSCOPIC OOPHORECTOMY (Right Abdomen)  Patient Location: PACU  Anesthesia Type:General  Level of Consciousness: awake, alert  and oriented  Airway & Oxygen Therapy: Patient Spontanous Breathing and Patient connected to face mask oxygen  Post-op Assessment: Report given to RN and Post -op Vital signs reviewed and stable  Post vital signs: Reviewed and stable  Last Vitals:  Vitals Value Taken Time  BP 111/78 04/23/20 0903  Temp 36.1 C 04/23/20 0903  Pulse 65 04/23/20 0903  Resp 15 04/23/20 0903  SpO2 100 % 04/23/20 0903  Vitals shown include unvalidated device data.  Last Pain:  Vitals:   04/23/20 0631  TempSrc: Tympanic  PainSc: 0-No pain         Complications: No complications documented.

## 2020-04-23 NOTE — Op Note (Signed)
Preoperative Diagnosis: 1) 39 y.o. with complex right ovarian cyst  Postoperative Diagnosis: 1) 39 y.o. with complex right ovarian cyst  Operation Performed: Laparoscopic right oophorectomy  Indication: 39 y.o. [id number]  with complex right ovarian cystectomy.    Surgeon: Malachy Mood, MD  Anesthesia: General  Preoperative Antibiotics: 2g ancef  Estimated Blood Loss: 5 mL  IV Fluids: 545mL  Urine Output:: 358mL  Drains or Tubes: none  Implants: none  Specimens Removed: right ovary  Complications: none  Intraoperative Findings: Absent tubes, uterus, and cervix .  Normal left ovary enlarged right ovary.  Normal liver edge, appendix, and ureters.    Patient Condition: stable  Procedure in Detail:  Patient was taken to the operating room where she was administered general anesthesia.  She was positioned in the dorsal lithotomy position utilizing Allen stirups, prepped and draped in the usual sterile fashion.  Prior to proceeding with procedure a time out was performed.  Attention was turned to the patient's pelvis.  A indwelling foley catheter was used to empty the patient's bladder.  A sponge stick was placed vaginally to delineate the vaginal cuff.  Attention was turned to the patient's abdomen.  The umbilicus was infiltrated with 1% Sensorcaine, before making a stab incision using an 11 blade scalpel.  A 60mm Excel trocar was then used to gain direct entry into the peritoneal cavity utilizing the camera to visualize progress of the trocar during placement.  Once peritoneal entry had been achieved, insufflation was started and pneumoperitoneum established at a pressure of 15mmHg.   General inspection of the abdomen revealed the above noted findings.  A left lower quadrant 82mm excel trocar was placed under direct visualiztion. .  The right ovary was mobile with a clearly defined infundibulopelvic ligament, ureter well visualized.  The infundibulopelvic ligament was sealed and  transected using a 31mm Harmonic scalpel..    Pedicle noted to be hemostatic, uterter well visualized and away from the surgical site.  The 91mm umbilical trocar was stepped up to an 61mm Excel trocar.  The ovary was placed within an endocatch retrieval bag.  Cyst was ruptured once bag brought to the level of the skin noting clear mucinous discharge.  Sample was sent for frozen pathology with read of benign ovarian cystic process.  The 23mm trocar was removed and the fascia at the trocar site was closed using a Carter-Thompson and 0 Vicryl.  The skin of the 59mm trocar site was closed with 4-0 Monocryl in a subcuticular fashion.  All trocar sites were then dressed with surgical skin glue.  The vaginal sponge stick and foley were removed.  Sponge needle and instrument counts were correct time two.  The patient tolerated the procedure well and was taken to the recovery room in stable condition.

## 2020-04-23 NOTE — Anesthesia Preprocedure Evaluation (Signed)
Anesthesia Evaluation  Patient identified by MRN, date of birth, ID band Patient awake    Reviewed: Allergy & Precautions, H&P , NPO status , Patient's Chart, lab work & pertinent test results, reviewed documented beta blocker date and time   Airway Mallampati: II  TM Distance: >3 FB Neck ROM: full   Comment: Good cervical mobitlity without pain  Dental  (+) Teeth Intact   Pulmonary neg pulmonary ROS, Patient did not abstain from smoking., former smoker,    Pulmonary exam normal        Cardiovascular Exercise Tolerance: Good negative cardio ROS Normal cardiovascular exam Rhythm:regular Rate:Normal     Neuro/Psych  Headaches, PSYCHIATRIC DISORDERS Anxiety Depression  Neuromuscular disease    GI/Hepatic Neg liver ROS, GERD  Medicated,  Endo/Other  negative endocrine ROS  Renal/GU negative Renal ROS  negative genitourinary   Musculoskeletal   Abdominal   Peds  Hematology negative hematology ROS (+)   Anesthesia Other Findings Past Medical History: No date: Anxiety No date: Depression     Comment:  PTSD No date: GERD (gastroesophageal reflux disease) No date: 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)     Comment:  Rheumatoid Past Surgical History: No date: COLPOSCOPY 04/14/2018: CYSTOSCOPY; N/A     Comment:  Procedure: CYSTOSCOPY;  Surgeon: Malachy Mood, MD;               Location: [facility] ORS;  Service: Gynecology;  Laterality:               N/A; 2014: FINGER SURGERY 04/14/2018: LAPAROSCOPIC HYSTERECTOMY; N/A     Comment:  Procedure: HYSTERECTOMY TOTAL LAPAROSCOPIC BILATERAL               SALPINGECTOMY;  Surgeon: Malachy Mood, MD;                Location: [facility] ORS;  Service: Gynecology;  Laterality:               N/A;   Reproductive/Obstetrics negative OB ROS                             Anesthesia Physical Anesthesia  Plan  ASA: III  Anesthesia Plan: General ETT   Post-op Pain Management:    Induction:   PONV Risk Score and Plan: 4 or greater  Airway Management Planned:   Additional Equipment:   Intra-op Plan:   Post-operative Plan:   Informed Consent: I have reviewed the patients History and Physical, chart, labs and discussed the procedure including the risks, benefits and alternatives for the proposed anesthesia with the patient or authorized representative who has indicated his/her understanding and acceptance.     Dental Advisory Given  Plan Discussed with: CRNA  Anesthesia Plan Comments:         Anesthesia Quick Evaluation

## 2020-04-23 NOTE — Interval H&P Note (Signed)
History and Physical Interval Note:  04/23/2020 7:20 AM  Lisa Peters  has presented today for surgery, with the diagnosis of Right ovarian cyst.  The various methods of treatment have been discussed with the patient and family. After consideration of risks, benefits and other options for treatment, the patient has consented to  Procedure(s): LAPAROSCOPIC RIGHT OVARIAN CYSTECTOMY (Right) as a surgical intervention.  The patient's history has been reviewed, patient examined (CV RRR Pulmonary CTAB), mark placed, no change in status, stable for surgery.  I have reviewed the patient's chart and labs.  Questions were answered to the patient's satisfaction.     Malachy Mood

## 2020-04-23 NOTE — Anesthesia Procedure Notes (Signed)
Procedure Name: Intubation Date/Time: 04/23/2020 7:55 AM Performed by: Marsh Dolly, CRNA Pre-anesthesia Checklist: Patient identified, Patient being monitored, Timeout performed, Emergency Drugs available and Suction available Patient Re-evaluated:Patient Re-evaluated prior to induction Oxygen Delivery Method: Circle system utilized Preoxygenation: Pre-oxygenation with 100% oxygen Induction Type: IV induction Ventilation: Mask ventilation without difficulty Laryngoscope Size: 3 and Miller Grade View: Grade I Tube type: Oral Tube size: 7.5 mm Number of attempts: 1 Placement Confirmation: ETT inserted through vocal cords under direct vision,  positive ETCO2 and breath sounds checked- equal and bilateral Secured at: 21 cm Tube secured with: Tape Dental Injury: Teeth and Oropharynx as per pre-operative assessment

## 2020-04-24 ENCOUNTER — Telehealth: Payer: Self-pay | Admitting: Obstetrics and Gynecology

## 2020-04-24 LAB — SURGICAL PATHOLOGY

## 2020-04-24 NOTE — Telephone Encounter (Signed)
Pt needs note to return to work tomorrow on 04/25/2020.

## 2020-04-25 ENCOUNTER — Encounter: Payer: Self-pay | Admitting: Obstetrics and Gynecology

## 2020-04-26 NOTE — Anesthesia Postprocedure Evaluation (Signed)
Anesthesia Post Note  Patient: Lisa Peters  Procedure(s) Performed: LAPAROSCOPIC OOPHORECTOMY (Right Abdomen)  Patient location during evaluation: PACU Anesthesia Type: General Level of consciousness: awake and alert Pain management: pain level controlled Vital Signs Assessment: post-procedure vital signs reviewed and stable Respiratory status: spontaneous breathing, nonlabored ventilation, respiratory function stable and patient connected to nasal cannula oxygen Cardiovascular status: blood pressure returned to baseline and stable Postop Assessment: no apparent nausea or vomiting Anesthetic complications: no   No complications documented.   Last Vitals:  Vitals:   04/23/20 1021 04/23/20 1055  BP: 100/67 106/70  Pulse: 68 66  Resp: 16 16  Temp: 36.8 C   SpO2: 100% 100%    Last Pain:  Vitals:   04/24/20 0948  TempSrc:   PainSc: [place] Shuntae Herzig

## 2020-04-29 ENCOUNTER — Encounter: Payer: Self-pay | Admitting: Obstetrics and Gynecology

## 2020-04-29 ENCOUNTER — Ambulatory Visit (INDEPENDENT_AMBULATORY_CARE_PROVIDER_SITE_OTHER): Payer: PRIVATE HEALTH INSURANCE | Admitting: Obstetrics and Gynecology

## 2020-04-29 ENCOUNTER — Other Ambulatory Visit: Payer: Self-pay

## 2020-04-29 VITALS — BP 100/58 | Ht 64.0 in | Wt 117.0 lb

## 2020-04-29 DIAGNOSIS — Z4889 Encounter for other specified surgical aftercare: Secondary | ICD-10-CM

## 2020-04-29 NOTE — Progress Notes (Signed)
Postoperative Follow-up Patient presents post op from laparoscopic right oophorectomy 1weeks ago for adnexal mass.  Subjective: Patient reports marked improvement in her preop symptoms. Eating a regular diet without difficulty. Pain is controlled without any medications.  Activity: normal activities of daily living.  Objective: Blood pressure (!) 100/58, height 5\' 4"  (1.626 m), weight 117 lb (53.1 kg).  General: NAD Pulmonary: no increased work of breathing Abdomen: soft, non-tender, non-distended, incision(s) D/C/I Extremities: no edema Neurologic: normal gait    Admission on 04/23/2020, Discharged on 04/23/2020  Component Date Value Ref Range Status  . Preg Test, Ur 04/23/2020 NEGATIVE  NEGATIVE Final   Comment:        THE SENSITIVITY OF THIS METHODOLOGY IS >24 mIU/mL   . SURGICAL PATHOLOGY 04/23/2020    Final-Edited                   Value:SURGICAL PATHOLOGY CASE: [id number] PATIENT: Einar Pheasant Surgical Pathology Report     Specimen Submitted: A. Right ovary  Clinical History: Right ovarian cyst      DIAGNOSIS: A. OVARY, RIGHT; OOPHORECTOMY: - BENIGN MUCINOUS CYSTADENOMA. - CYSTIC FOLLICLES. - NEGATIVE FOR MALIGNANCY.   GROSS DESCRIPTION: Intraoperative Consultation:     Labeled: Right ovary     Received: Fresh     Specimen: Oophorectomy     Pathologic evaluation performed: Frozen section diagnosis     Diagnosis: FS: Negative for malignancy on representative section     Communicated to: Called to Dr. Georgianne Fick at 8:43 AM on 04/23/2020 Betsy Pries M.D.     Tissue submitted: Representative sections are frozen as FSA and 3 frozen section slides are performed.  A. Labeled: Right ovary Received: Fresh Type of procedure: Oophorectomy Integrity: Highly disrupted and multiple fragments Weight of specimen: 7.71 grams Size of specimen:           Ovary: Aggregate, 5.1 x 4.3 x 1.9                          cm           Fallopian tube: A  distinct portion of fallopian tube is not grossly appreciated. Ovarian external surface: The specimen is received in multiple fragments.  The presumed serosal surfaces are tan-pink, smooth, and glistening.  There are focal areas that are cerebriform. Ovarian internal surface: The internal surface is mainly comprised of tan-pink smooth cyst walls.  The largest fragment is serially sectioned revealing multiple yellow and white cyst like areas ranging from 0.2 to 0.9 cm in greatest dimension.  No distinct masses or lesions are grossly appreciated.  Representative sections of the specimen are frozen as FSA with 3 frozen section slides performed. Block summary: 1 - FSA frozen section remnant, representative sections of ovary 2 - 6 - additional representative sections of ovary and cyst walls  Final Diagnosis performed by Betsy Pries, MD.   Electronically signed 04/24/2020 10:42:17AM The electronic signature indicates that the named Attendin                         g Pathologist has evaluated the specimen Technical component performed at [facility], [address] [place], [place] [address] Lab: [phone] Dir: Rush Farmer, MD, MMM  Professional component performed at [facility], [facility], [place], [place], [place] [address] Lab: [phone] Dir: Dellia Nims. Reuel Derby, MD     Assessment: 39 y.o. s/p laparoscopic right oophorectomy stable  Plan: Patient has done well after surgery with no apparent complications.  I have discussed the post-operative course to date, and the expected progress moving forward.  The patient understands what complications to be concerned about.  I will see the patient in routine follow up, or sooner if needed.    Activity plan: No heavy lifting.  Return in about 5 weeks (around 06/03/2020) for 6 week postpartum visit.   Malachy Mood, MD, [facility], [facility] 04/29/2020, 2:33 PM

## 2020-08-12 ENCOUNTER — Ambulatory Visit: Payer: Self-pay | Admitting: Urology

## 2020-08-12 ENCOUNTER — Encounter: Payer: Self-pay | Admitting: Urology

## 2020-09-05 ENCOUNTER — Ambulatory Visit: Payer: Self-pay | Admitting: Urology

## 2020-09-12 ENCOUNTER — Ambulatory Visit (INDEPENDENT_AMBULATORY_CARE_PROVIDER_SITE_OTHER): Payer: PRIVATE HEALTH INSURANCE | Admitting: Urology

## 2020-09-12 ENCOUNTER — Other Ambulatory Visit: Payer: Self-pay

## 2020-09-12 ENCOUNTER — Encounter: Payer: Self-pay | Admitting: Urology

## 2020-09-12 VITALS — BP 121/73 | HR 76 | Ht 64.0 in | Wt 108.0 lb

## 2020-09-12 DIAGNOSIS — R3129 Other microscopic hematuria: Secondary | ICD-10-CM

## 2020-09-12 NOTE — Patient Instructions (Signed)
Cystoscopy Cystoscopy is a procedure that is used to help diagnose and sometimes treat conditions that affect the lower urinary tract. The lower urinary tract includes the bladder and the urethra. The urethra is the tube that drains urine from the bladder. Cystoscopy is done using a thin, tube-shaped instrument with a light and camera at the end (cystoscope). The cystoscope may be hard or flexible, depending on the goal of the procedure. The cystoscope is inserted through the urethra, into the bladder. Cystoscopy may be recommended if you have:  Urinary tract infections that keep coming back.  Blood in the urine (hematuria).  An inability to control when you urinate (urinary incontinence) or an overactive bladder.  Unusual cells found in a urine sample.  A blockage in the urethra, such as a urinary stone.  Painful urination.  An abnormality in the bladder found during an intravenous pyelogram (IVP) or CT scan. Cystoscopy may also be done to remove a sample of tissue to be examined under a microscope (biopsy). What are the risks? Generally, this is a safe procedure. However, problems may occur, including:  Infection.  Bleeding.  What happens during the procedure?  1. You will be given one or more of the following: ? A medicine to numb the area (local anesthetic). 2. The area around the opening of your urethra will be cleaned. 3. The cystoscope will be passed through your urethra into your bladder. 4. Germ-free (sterile) fluid will flow through the cystoscope to fill your bladder. The fluid will stretch your bladder so that your health care provider can clearly examine your bladder walls. 5. Your doctor will look at the urethra and bladder. 6. The cystoscope will be removed The procedure may vary among health care providers  What can I expect after the procedure? After the procedure, it is common to have: 1. Some soreness or pain in your abdomen and urethra. 2. Urinary symptoms.  These include: ? Mild pain or burning when you urinate. Pain should stop within a few minutes after you urinate. This may last for up to 1 week. ? A small amount of blood in your urine for several days. ? Feeling like you need to urinate but producing only a small amount of urine. Follow these instructions at home: General instructions  Return to your normal activities as told by your health care provider.   Do not drive for 24 hours if you were given a sedative during your procedure.  Watch for any blood in your urine. If the amount of blood in your urine increases, call your health care provider.  If a tissue sample was removed for testing (biopsy) during your procedure, it is up to you to get your test results. Ask your health care provider, or the department that is doing the test, when your results will be ready.  Drink enough fluid to keep your urine pale yellow.  Keep all follow-up visits as told by your health care provider. This is important. Contact a health care provider if you:  Have pain that gets worse or does not get better with medicine, especially pain when you urinate.  Have trouble urinating.  Have more blood in your urine. Get help right away if you:  Have blood clots in your urine.  Have abdominal pain.  Have a fever or chills.  Are unable to urinate. Summary  Cystoscopy is a procedure that is used to help diagnose and sometimes treat conditions that affect the lower urinary tract.  Cystoscopy is done using   a thin, tube-shaped instrument with a light and camera at the end.  After the procedure, it is common to have some soreness or pain in your abdomen and urethra.  Watch for any blood in your urine. If the amount of blood in your urine increases, call your health care provider.  If you were prescribed an antibiotic medicine, take it as told by your health care provider. Do not stop taking the antibiotic even if you start to feel better. This  information is not intended to replace advice given to you by your health care provider. Make sure you discuss any questions you have with your health care provider. Document Revised: 04/19/2018 Document Reviewed: 04/19/2018 Elsevier Patient Education  2020 Elsevier Inc.   

## 2020-09-12 NOTE — Progress Notes (Signed)
09/12/20 12:34 PM   Lisa Peters [birth year] [id number]  CC: Microscopic hematuria  HPI: I saw Ms. Lisa Peters in urology clinic for the above issues.  She is a 40 year old female with a ~10 pack-year smoking history who was referred for microscopic hematuria.  In March 2020 she was found to have 10-50 RBCs on urinalysis without any evidence of infection.  She denies any other carcinogenic exposures.  Her surgical history is notable for a laparoscopic hysterectomy in 2019, and most recently laparoscopic removal of a right ovarian lesion, and pathology was benign.  She occasionally will have some discomfort in the pelvis, but denies any dysuria or other significant urinary symptoms.  She had a CT in December 2021 with contrast that showed no urologic abnormalities, specifically no nephrolithiasis or hydronephrosis.  Urinalysis today shows 3-10 RBCs, 0-5 WBCs, many bacteria, nitrite negative, no leukocytes.   PMH: Past Medical History:  Diagnosis Date  . Anxiety   . Depression    PTSD  . GERD (gastroesophageal reflux disease)   . 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 Rheumatoid arthritis ([place])    Rheumatoid    Surgical History: Past Surgical History:  Procedure Laterality Date  . COLPOSCOPY    . CYSTOSCOPY N/A 04/14/2018   Procedure: CYSTOSCOPY;  Surgeon: Malachy Mood, MD;  Location: [facility] ORS;  Service: Gynecology;  Laterality: N/A;  . FINGER SURGERY  2014  . LAPAROSCOPIC HYSTERECTOMY N/A 04/14/2018   Procedure: HYSTERECTOMY TOTAL LAPAROSCOPIC BILATERAL SALPINGECTOMY;  Surgeon: Malachy Mood, MD;  Location: [facility] ORS;  Service: Gynecology;  Laterality: N/A;  . SALPINGOOPHORECTOMY      Family History: Family History  Problem Relation Age of Onset  . Diabetes Mother   . Arthritis Father   . Diabetes Father        hypoglycemia  . Stroke Paternal Grandmother   . Heart disease Paternal Grandfather   . Breast cancer Neg Hx     Social  History:  reports that she has been smoking cigarettes. She has a 10.00 pack-year smoking history. She has never used smokeless tobacco. She reports current alcohol use. She reports that she does not use drugs.  Physical Exam: BP 121/73 (BP Location: Left Arm, Patient Position: Sitting, Cuff Size: Normal)   Pulse 76   Ht 5\' 4"  (1.626 m)   Wt 108 lb (49 kg)   LMP  (LMP Unknown)   BMI 18.54 kg/m    Constitutional:  Alert and oriented, No acute distress. Cardiovascular: No clubbing, cyanosis, or edema. Respiratory: Normal respiratory effort, no increased work of breathing. GI: Abdomen is soft, nontender, nondistended, no abdominal masses  Laboratory Data: Reviewed, see HPI  Pertinent Imaging: I have personally viewed and interpreted the CT dated 04/11/2020 that shows no hydronephrosis or stone disease  Assessment & Plan:   40 year old female with 10-pack-year smoking history and no other carcinogenic exposures with asymptomatic microscopic hematuria.  We discussed common possible etiologies of microscopic hematuria including idiopathic, urolithiasis, medical renal disease, and malignancy. We discussed the new asymptomatic microscopic hematuria guidelines and risk categories of low, intermediate, and high risk that are based on age, risk factors like smoking, and degree of microscopic hematuria. We discussed work-up can range from repeat urinalysis, renal ultrasound and cystoscopy, to CT urogram and cystoscopy.  She recently had a CT in December 2021 that was essentially benign from a urologic perspective.  She has had a gynecologic surgery since then with laparoscopic removal of a right ovarian complex cyst  that was ultimately benign.  I recommend starting with a cystoscopy to complete her microscopic hematuria work-up, and consider further imaging pending those findings.  Nickolas Madrid, MD 09/12/2020  [facility] [address], [place], [place]  [address] [phone]

## 2020-09-13 LAB — URINALYSIS, COMPLETE
Bilirubin, UA: NEGATIVE
Glucose, UA: NEGATIVE
Ketones, UA: NEGATIVE
Leukocytes,UA: NEGATIVE
Nitrite, UA: NEGATIVE
Protein,UA: NEGATIVE
Specific Gravity, UA: 1.025 (ref 1.005–1.030)
Urobilinogen, Ur: 0.2 mg/dL (ref 0.2–1.0)
pH, UA: 5.5 (ref 5.0–7.5)

## 2020-09-13 LAB — MICROSCOPIC EXAMINATION: Epithelial Cells (non renal): >10 /hpf — AB (ref 0–10)

## 2020-09-16 ENCOUNTER — Encounter: Payer: Self-pay | Admitting: Urology

## 2020-09-16 ENCOUNTER — Ambulatory Visit (INDEPENDENT_AMBULATORY_CARE_PROVIDER_SITE_OTHER): Payer: PRIVATE HEALTH INSURANCE | Admitting: Urology

## 2020-09-16 ENCOUNTER — Other Ambulatory Visit: Payer: Self-pay

## 2020-09-16 VITALS — BP 111/76 | HR 91 | Ht 64.0 in | Wt 108.0 lb

## 2020-09-16 DIAGNOSIS — R3129 Other microscopic hematuria: Secondary | ICD-10-CM

## 2020-09-16 MED ORDER — LIDOCAINE HCL URETHRAL/MUCOSAL 2 % EX GEL
1.0000 "application " | Freq: Once | CUTANEOUS | Status: AC
  Administered 2020-09-16: 1 via URETHRAL

## 2020-09-16 NOTE — Progress Notes (Signed)
Cystoscopy Procedure Note:  Indication: Microscopic hematuria  After informed consent and discussion of the procedure and its risks, Lisa Peters was positioned and prepped in the standard fashion. Cystoscopy was performed with a flexible cystoscope. The urethra, bladder neck and entire bladder was visualized in a standard fashion. The ureteral orifices were visualized in their normal location and orientation. Bladder mucosa normal throughout, no suspicious,   Imaging: CT December 2021 with contrast with no urologic abnormalities  Findings: Normal cystoscopy  Assessment and Plan: RTC 1 year with UA prior  Nickolas Madrid, MD 09/16/2020

## 2020-09-16 NOTE — Addendum Note (Signed)
Addended by: Tommy Rainwater on: 09/16/2020 01:16 PM   Modules accepted: Orders

## 2021-04-11 ENCOUNTER — Other Ambulatory Visit: Payer: Self-pay

## 2021-04-11 ENCOUNTER — Encounter: Payer: Self-pay | Admitting: Obstetrics and Gynecology

## 2021-04-11 ENCOUNTER — Ambulatory Visit (INDEPENDENT_AMBULATORY_CARE_PROVIDER_SITE_OTHER): Payer: No Typology Code available for payment source | Admitting: Obstetrics and Gynecology

## 2021-04-11 VITALS — BP 100/62 | HR 80 | Ht 64.0 in | Wt 113.0 lb

## 2021-04-11 DIAGNOSIS — Z01419 Encounter for gynecological examination (general) (routine) without abnormal findings: Secondary | ICD-10-CM

## 2021-04-11 DIAGNOSIS — Z1239 Encounter for other screening for malignant neoplasm of breast: Secondary | ICD-10-CM | POA: Diagnosis not present

## 2021-04-11 NOTE — Patient Instructions (Signed)
[facility] [address] [place] [address]  [facility]  [address] [place] [address]  Phone: [phone]  

## 2021-04-11 NOTE — Progress Notes (Signed)
Gynecology Annual Exam  PCP: Tracie Harrier, MD  Chief Complaint:  Chief Complaint  Patient presents with   Gynecologic Exam    Annual -  no concerns. RM 5    History of Present Illness: Patient is a 40 y.o. [id number] presents for annual exam. The patient has no complaints today.   LMP: No LMP recorded (lmp unknown). Patient has had a hysterectomy.  The patient is not currently sexually active. She currently uses status post hysterectomy for contraception. The patient does perform self breast exams.  There is no notable family history of breast or ovarian cancer in her family.  The patient wears seatbelts: yes.   The patient has regular exercise: not asked.    The patient denies current symptoms of depression.    Review of Systems: Review of Systems  Constitutional:  Negative for chills and fever.  HENT:  Negative for congestion.   Respiratory:  Negative for cough and shortness of breath.   Cardiovascular:  Negative for chest pain and palpitations.  Gastrointestinal:  Negative for abdominal pain, constipation, diarrhea, heartburn, nausea and vomiting.  Genitourinary:  Negative for dysuria, frequency and urgency.  Skin:  Negative for itching and rash.  Neurological:  Negative for dizziness and headaches.  Endo/Heme/Allergies:  Negative for polydipsia.  Psychiatric/Behavioral:  Negative for depression.    Past Medical History:  Patient Active Problem List   Diagnosis Date Noted   S/P laparoscopy    History of cellulitis 11/08/2019   History of fall 08/18/2018   Closed nondisplaced fracture of head of left radius 07/01/2018   S/P laparoscopic hysterectomy 04/14/2018   Esophagitis 02/14/2018   Hiatal hernia 02/14/2018   Adenomyosis 02/02/2018   Chronic pelvic pain in female 02/02/2018   Abnormal uterine bleeding 02/02/2018   Vitamin D deficiency 09/06/2017   Anxiety 08/17/2017   Allergic rhinitis 08/17/2017   Epigastric pain 05/12/2017   Fatigue 05/12/2017    Gastroesophageal reflux disease without esophagitis 05/12/2017   Psychophysiological insomnia 05/12/2017   Ankylosing spondylitis of multiple sites in spine ([place]) 02/23/2017   Hyperlipidemia, mixed 06/12/2016   Long term current use of non-steroidal anti-inflammatories (NSAID) 05/25/2016   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/24/2015   Chronic neck pain 12/24/2015   High risk medication use 12/24/2015   HLA B27 (HLA B27 positive) 12/24/2015   Migraine without status migrainosus, not intractable 12/24/2015   Morning stiffness of joints 12/24/2015   Numbness and tingling in both hands 12/24/2015   Pain of both elbows 12/24/2015   Risk for falls 12/24/2015   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 Some PAC's on holter monitor.  Thought to be mainly r/t adderall.     Past Surgical History:  Past Surgical History:  Procedure Laterality Date   COLPOSCOPY     CYSTOSCOPY N/A 04/14/2018   Procedure: CYSTOSCOPY;  Surgeon: Malachy Mood, MD;  Location: [facility] ORS;  Service: Gynecology;  Laterality: N/A;   FINGER SURGERY  2014   LAPAROSCOPIC HYSTERECTOMY N/A 04/14/2018   Procedure: HYSTERECTOMY TOTAL LAPAROSCOPIC BILATERAL SALPINGECTOMY;  Surgeon: Malachy Mood, MD;  Location: [facility] ORS;  Service: Gynecology;  Laterality: N/A;   SALPINGOOPHORECTOMY      Gynecologic History:  No LMP recorded (lmp unknown). Patient has had a hysterectomy. Contraception: status post hysterectomy Last Pap: Results were: N/A s/p prior hysterectomy  Last mammogram: 04/19/2020 Results were: Gillian Shields I  Obstetric  History: [id number]  Family History:  Family History  Problem Relation Age of Onset   Diabetes Mother    Arthritis Father    Diabetes Father        hypoglycemia   Stroke Paternal Grandmother    Heart disease Paternal Grandfather    Breast cancer Neg Hx      Social History:  Social History   Socioeconomic History   Marital status: Married    Spouse name: Not on file   Number of children: Not on file   Years of education: Not on file   Highest education level: Not on file  Occupational History   Not on file  Tobacco Use   Smoking status: Every Day    Packs/day: 0.50    Years: 20.00    Pack years: 10.00    Types: Cigarettes   Smokeless tobacco: Never  Vaping Use   Vaping Use: Some days   Substances: Nicotine  Substance and Sexual Activity   Alcohol use: Yes    Comment: occassionally   Drug use: No   Sexual activity: Yes    Birth control/protection: Surgical  Other Topics Concern   Not on file  Social History Narrative   Not on file   Social Determinants of Health   Financial Resource Strain: Not on file  Food Insecurity: Not on file  Transportation Needs: Not on file  Physical Activity: Not on file  Stress: Not on file  Social Connections: Not on file  Intimate Partner Violence: Not on file    Allergies:  Allergies  Allergen Reactions   Sulfamethoxazole-Trimethoprim Hives, Itching and Nausea Only    Other reaction(s): Abdominal Pain, Other (See Comments) fatigue fatigue    Sulfa Antibiotics Rash    Medications: Prior to Admission medications   Medication Sig Start Date End Date Taking? Authorizing Provider  Adalimumab (HUMIRA PEN) 40 MG/0.4ML PNKT Inject into the skin. 09/12/20   [provider]  ALPRAZolam Duanne Moron) 0.5 MG tablet Take 0.5 mg by mouth daily as needed. 08/20/20   [provider]  Cholecalciferol (VITAMIN D) 2000 units CAPS Take 2,000 Units by mouth daily.    [provider]  clindamycin (CLEOCIN T) 1 % lotion Apply topically every morning. 09/13/20   [provider]  ergocalciferol (VITAMIN D2) 1.25 MG (50000 UT) capsule Take 1 capsule by mouth every 7 (seven) days. 07/26/20   [provider]  ibuprofen (ADVIL) 800 MG tablet Take 1 tablet (800 mg total)  by mouth every 8 (eight) hours as needed for moderate pain. 04/23/20   Malachy Mood, MD  mirtazapine (REMERON) 15 MG tablet Take 15 mg by mouth at bedtime.    [provider]  pantoprazole (PROTONIX) 40 MG tablet Take 1 tablet (40 mg total) by mouth daily. 08/17/17   Juline Patch, MD  tretinoin (RETIN-A) 0.025 % cream Apply topically. 09/13/20   [provider]    Physical Exam Vitals: Blood pressure 100/62, pulse 80, height '5\' 4"'  (1.626 m), weight 113 lb (51.3 kg).  General: NAD HEENT: normocephalic, anicteric Thyroid: no enlargement, no palpable nodules Pulmonary: No increased work of breathing, CTAB Cardiovascular: RRR, distal pulses 2+ Breast: Breast symmetrical, no tenderness, no palpable nodules or masses, no skin or nipple retraction present, no nipple discharge.  No axillary or supraclavicular lymphadenopathy. Abdomen: NABS, soft, non-tender, non-distended.  Umbilicus without lesions.  No hepatomegaly, splenomegaly or masses palpable. No evidence of hernia  Genitourinary:  External: Normal external female genitalia.  Normal urethral meatus, normal  Bartholin's and Skene's glands.    Vagina: Normal vaginal mucosa, no evidence of prolapse.    Cervix: surgically absent  Uterus: surgically absent  Adnexa: ovaries non-enlarged, no adnexal masses  Rectal: deferred  Lymphatic: no evidence of inguinal lymphadenopathy Extremities: no edema, erythema, or tenderness Neurologic: Grossly intact Psychiatric: mood appropriate, affect full  Female chaperone present for pelvic and breast  portions of the physical exam    Assessment: 41 y.o. G3P3003 routine annual exam  Plan: Problem List Items Addressed This Visit   None Visit Diagnoses     Encounter for gynecological examination without abnormal finding    -  Primary   Breast screening       Relevant Orders   MM 3D SCREEN BREAST BILATERAL       1) Mammogram - recommend yearly screening mammogram.  Mammogram  Was ordered today   2) STI screening  was notoffered and therefore not obtained  3) ASCCP guidelines and rational discussed.  Patient opts for discontinue secondary to prior hysterectomy screening interval  4) Contraception - the patient is currently using  status post hysterectomy.  She is not currently in need of contraception secondary to being sterile  5) Colonoscopy -- start age 46  6) Routine healthcare maintenance including cholesterol, diabetes screening discussed managed by PCP  7) Return in about 1 year (around 04/11/2022) for annual.   Malachy Mood, MD, [place], [facility] 04/11/2021, 1:56 PM

## 2021-09-18 ENCOUNTER — Ambulatory Visit: Payer: Self-pay | Admitting: Urology

## 2021-09-19 ENCOUNTER — Encounter: Payer: Self-pay | Admitting: Urology

## 2022-01-29 ENCOUNTER — Other Ambulatory Visit: Payer: Self-pay | Admitting: Certified Nurse Midwife

## 2022-01-29 DIAGNOSIS — Z1231 Encounter for screening mammogram for malignant neoplasm of breast: Secondary | ICD-10-CM

## 2022-06-25 ENCOUNTER — Ambulatory Visit
Admission: EM | Admit: 2022-06-25 | Discharge: 2022-06-25 | Payer: No Typology Code available for payment source | Attending: Emergency Medicine | Admitting: Emergency Medicine

## 2022-06-25 ENCOUNTER — Encounter: Payer: Self-pay | Admitting: Emergency Medicine

## 2022-06-25 ENCOUNTER — Emergency Department: Payer: No Typology Code available for payment source

## 2022-06-25 ENCOUNTER — Emergency Department
Admission: EM | Admit: 2022-06-25 | Discharge: 2022-06-25 | Disposition: A | Discharge status: Home / Self Care | Payer: No Typology Code available for payment source | Attending: Emergency Medicine | Admitting: Emergency Medicine

## 2022-06-25 ENCOUNTER — Other Ambulatory Visit: Payer: Self-pay

## 2022-06-25 DIAGNOSIS — R079 Chest pain, unspecified: Secondary | ICD-10-CM | POA: Insufficient documentation

## 2022-06-25 DIAGNOSIS — R5383 Other fatigue: Secondary | ICD-10-CM

## 2022-06-25 DIAGNOSIS — R1010 Upper abdominal pain, unspecified: Secondary | ICD-10-CM | POA: Insufficient documentation

## 2022-06-25 DIAGNOSIS — R0789 Other chest pain: Secondary | ICD-10-CM | POA: Diagnosis not present

## 2022-06-25 DIAGNOSIS — R002 Palpitations: Secondary | ICD-10-CM | POA: Diagnosis not present

## 2022-06-25 DIAGNOSIS — R059 Cough, unspecified: Secondary | ICD-10-CM | POA: Diagnosis not present

## 2022-06-25 DIAGNOSIS — R0602 Shortness of breath: Secondary | ICD-10-CM

## 2022-06-25 LAB — BASIC METABOLIC PANEL
Anion gap: 6 (ref 5–15)
BUN: 6 mg/dL (ref 6–20)
CO2: 27 mmol/L (ref 22–32)
Calcium: 8.7 mg/dL — ABNORMAL LOW (ref 8.9–10.3)
Chloride: 105 mmol/L (ref 98–111)
Creatinine, Ser: 0.65 mg/dL (ref 0.44–1.00)
GFR, Estimated: >60 mL/min (ref >60)
Glucose, Bld: 90 mg/dL (ref 70–99)
Potassium: 3.9 mmol/L (ref 3.5–5.1)
Sodium: 138 mmol/L (ref 135–145)

## 2022-06-25 LAB — CBC
HCT: 37.8 % (ref 36.0–46.0)
Hemoglobin: 12.5 g/dL (ref 12.0–15.0)
MCH: 33.2 pg (ref 26.0–34.0)
MCHC: 33.1 g/dL (ref 30.0–36.0)
MCV: 100.5 fL — ABNORMAL HIGH (ref 80.0–100.0)
Platelets: 335 10*3/uL (ref 150–400)
RBC: 3.76 MIL/uL — ABNORMAL LOW (ref 3.87–5.11)
RDW: 12.8 % (ref 11.5–15.5)
WBC: 7.6 10*3/uL (ref 4.0–10.5)
nRBC: 0 % (ref 0.0–0.2)

## 2022-06-25 LAB — TROPONIN I (HIGH SENSITIVITY)
Troponin I (High Sensitivity): <2 ng/L (ref <18)
Troponin I (High Sensitivity): <2 ng/L (ref <18)

## 2022-06-25 MED ORDER — ASPIRIN 81 MG PO CHEW
324.0000 mg | CHEWABLE_TABLET | Freq: Once | ORAL | Status: AC
  Administered 2022-06-25: 324 mg via ORAL

## 2022-06-25 NOTE — ED Provider Notes (Signed)
[facility]    CSN: [id number] Arrival date & time: 06/25/22  0800      History   Chief Complaint Chief Complaint  Patient presents with   Chest Pain   Shortness of Breath    HPI Lisa Peters is a 42 y.o. female.   HPI  42 year old female here for evaluation of cardiac complaints.  The patient reports that approximate 1015 last night she developed chest pain in the middle of her chest and also a feeling of her heart fluttering.  She states if she coughs she feels like she resets the fluttering but it still returns.  It has been off and on since 1015.  She called EMS last night who did an EKG and recommended transport to the ER but she refused at that time.  She is continued to have fatigue, lightheadedness, nausea, chest pain, and a cough.  She does endorse shortness of breath and feeling cold, clammy, and having chills.  She is a smoker and smokes 1 pack/day and has been doing that for the last 3 to 4 months but has been smoking off and on since the age of 11.  She does endorse some runny nose, nasal congestion, and intermittent mucus production to her cough.  Her paternal grandfather passed away from CHF.  The patient also reports that she has been experiencing intermittent leg swelling from knees to ankles with them becoming red and splotchy for the last several months.  She does have a history of RA but does not seen regularly for that.  She does go to [facility].  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    Rheumatoid arthritis ([place])    Rheumatoid    Patient Active Problem List   Diagnosis Date Noted   S/P laparoscopy    History of cellulitis 11/08/2019   History of fall 08/18/2018   Closed nondisplaced fracture of head of left radius 07/01/2018   S/P laparoscopic hysterectomy 04/14/2018   Esophagitis 02/14/2018    Hiatal hernia 02/14/2018   Adenomyosis 02/02/2018   Chronic pelvic pain in female 02/02/2018   Abnormal uterine bleeding 02/02/2018   Vitamin D deficiency 09/06/2017   Anxiety 08/17/2017   Allergic rhinitis 08/17/2017   Epigastric pain 05/12/2017   Fatigue 05/12/2017   Gastroesophageal reflux disease without esophagitis 05/12/2017   Psychophysiological insomnia 05/12/2017   Ankylosing spondylitis of multiple sites in spine ([place]) 02/23/2017   Hyperlipidemia, mixed 06/12/2016   Long term current use of non-steroidal anti-inflammatories (NSAID) 05/25/2016   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/24/2015   Chronic neck pain 12/24/2015   High risk medication use 12/24/2015   HLA B27 (HLA B27 positive) 12/24/2015   Migraine without status migrainosus, not intractable 12/24/2015   Morning stiffness of joints 12/24/2015   Numbness and tingling in both hands 12/24/2015   Pain of both elbows 12/24/2015   Risk for falls 12/24/2015   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   Poor concentration 03/22/2015   Palpitations 03/22/2015    Past Surgical History:  Procedure Laterality Date   ABDOMINAL HYSTERECTOMY  2019   COLPOSCOPY     CYSTOSCOPY N/A 04/14/2018   Procedure: CYSTOSCOPY;  Surgeon: Malachy Mood, MD;  Location: [facility] ORS;  Service: Gynecology;  Laterality: N/A;   FINGER SURGERY  2014   LAPAROSCOPIC HYSTERECTOMY N/A 04/14/2018  Procedure: HYSTERECTOMY TOTAL LAPAROSCOPIC BILATERAL SALPINGECTOMY;  Surgeon: Malachy Mood, MD;  Location: [facility] ORS;  Service: Gynecology;  Laterality: N/A;   SALPINGOOPHORECTOMY      OB History     Gravida  3   Para  3   Term  3   Preterm      AB      Living  3      SAB      IAB      Ectopic      Multiple      Live Births  3            Home Medications    Prior to Admission medications   Medication Sig Start Date End Date Taking? Authorizing Provider  Adalimumab (HUMIRA PEN) 40 MG/0.4ML PNKT  Inject into the skin. 09/12/20   [provider]  ALPRAZolam Duanne Moron) 0.5 MG tablet Take 0.5 mg by mouth daily as needed. 08/20/20   [provider]  Cholecalciferol (VITAMIN D) 2000 units CAPS Take 2,000 Units by mouth daily.    [provider]  clindamycin (CLEOCIN T) 1 % lotion Apply topically every morning. Patient not taking: Reported on 04/11/2021 09/13/20   [provider]  ergocalciferol (VITAMIN D2) 1.25 MG (50000 UT) capsule Take 1 capsule by mouth every 7 (seven) days. Patient not taking: Reported on 04/11/2021 07/26/20   [provider]  ibuprofen (ADVIL) 800 MG tablet Take 1 tablet (800 mg total) by mouth every 8 (eight) hours as needed for moderate pain. 04/23/20   Malachy Mood, MD  mirtazapine (REMERON) 15 MG tablet Take 15 mg by mouth at bedtime. Patient not taking: Reported on 04/11/2021    [provider]  pantoprazole (PROTONIX) 40 MG tablet Take 1 tablet (40 mg total) by mouth daily. 08/17/17   Juline Patch, MD  tretinoin (RETIN-A) 0.025 % cream Apply topically. Patient not taking: Reported on 04/11/2021 09/13/20   [provider]    Family History Family History  Problem Relation Age of Onset   Diabetes Mother    Arthritis Father    Diabetes Father        hypoglycemia   Stroke Paternal Grandmother    Heart disease Paternal Grandfather    Breast cancer Neg Hx     Social History Social History   Tobacco Use   Smoking status: Every Day    Packs/day: 0.50    Years: 20.00    Total pack years: 10.00    Types: Cigarettes   Smokeless tobacco: Never  Vaping Use   Vaping Use: Some days   Substances: Nicotine  Substance Use Topics   Alcohol use: Yes    Comment: occassionally   Drug use: No     Allergies   Sulfamethoxazole-trimethoprim and Sulfa antibiotics   Review of Systems Review of Systems  Constitutional:  Positive for chills, diaphoresis and fatigue.  HENT:  Positive for congestion and  rhinorrhea. Negative for sore throat.   Respiratory:  Positive for cough and shortness of breath.   Cardiovascular:  Positive for chest pain, palpitations and leg swelling.  Gastrointestinal:  Positive for nausea.  Skin:  Positive for color change.     Physical Exam Triage Vital Signs ED Triage Vitals  Enc Vitals Group     BP      Pulse      Resp      Temp      Temp src      SpO2  Weight      Height      Head Circumference      Peak Flow      Pain Score      Pain Loc      Pain Edu?      Excl. in [place]?    No data found.  Updated Vital Signs BP 117/75 (BP Location: Right Arm)   Pulse 64   Temp 97.9 F (36.6 C) (Oral)   Ht 5' 4"$  (1.626 m)   Wt 107 lb (48.5 kg)   LMP  (LMP Unknown)   SpO2 99%   BMI 18.37 kg/m   Visual Acuity Right Eye Distance:   Left Eye Distance:   Bilateral Distance:    Right Eye Near:   Left Eye Near:    Bilateral Near:     Physical Exam Vitals and nursing note reviewed.  Constitutional:      Appearance: She is ill-appearing.  HENT:     Head: Normocephalic and atraumatic.     Right Ear: Tympanic membrane, ear canal and external ear normal. There is no impacted cerumen.     Left Ear: Tympanic membrane, ear canal and external ear normal. There is no impacted cerumen.     Nose: Congestion and rhinorrhea present.     Comments: Nasal mucosa is mildly erythematous and edematous with scant clear discharge in both nares.    Mouth/Throat:     Mouth: Mucous membranes are moist.     Pharynx: Oropharynx is clear. Posterior oropharyngeal erythema present. No oropharyngeal exudate.     Comments: Patient is mild erythema in the posterior pharynx with clear postnasal drip.  No exudate appreciated. Neck:     Vascular: No carotid bruit.     Comments: When auscultating for bruits over the carotid arteries patient became dizzy and flat back on the bed complaining of her heart fluttering. Cardiovascular:     Rate and Rhythm: Regular rhythm.  Bradycardia present.     Pulses: Normal pulses.     Heart sounds: Normal heart sounds. No murmur heard.    No friction rub. No gallop.  Pulmonary:     Effort: Pulmonary effort is normal.     Breath sounds: Normal breath sounds. No wheezing, rhonchi or rales.  Musculoskeletal:     Cervical back: Normal range of motion and neck supple.  Lymphadenopathy:     Cervical: No cervical adenopathy.  Skin:    General: Skin is warm and dry.     Capillary Refill: Capillary refill takes less than 2 seconds.  Neurological:     General: No focal deficit present.     Mental Status: She is alert and oriented to person, place, and time.      UC Treatments / Results  Labs (all labs ordered are listed, but only abnormal results are displayed) Labs Reviewed - No data to display  EKG Sinus bradycardia with sinus arrhythmia ventricular rate of 56 bpm PR interval 148 ms QRS duration 78 ms QT/QTc 422/407 ms There is a flattening of the ST segment in V2 but otherwise no other abnormalities noted. Other than rate it is largely unchanged from EKG on 04/11/2020.  Radiology No results found.  Procedures Procedures (including critical care time)  Medications Ordered in UC Medications  aspirin chewable tablet 324 mg (324 mg Oral Given 06/25/22 0858)    Initial Impression / Assessment and Plan / UC Course  I have reviewed the triage vital signs and the nursing notes.  Pertinent labs &  imaging results that were available during my care of the patient were reviewed by me and considered in my medical decision making (see chart for details).   Patient is a moderately ill-appearing 42 year old female presenting for fatigue, lightheadedness, nausea, intermittent cough, and intermittent chest pain since last night.  She states she was like her heart is fluttering and she states that when she coughs she feels like this resets.  The patient has had some upper respiratory symptoms but she is also a smoker.  When  she began having symptoms last night she called EMS who did an EKG and recommended transport to the ER.  Patient refused at that time.  Patient is a smoker and has a family history of cardiac problems, her paternal grandfather died of CHF.  She also has a history of RA but is not routinely managed by a physician and she does not have a primary care at present.  When auscultating for bruise over her carotid arteries the patient states that she felt really dizzy and flopped back on the exam table.  She did not lose consciousness.  She was complaining of her heart fluttering.  Auscultation of the chest revealed S1-S2 heart sounds at that time without any ectopy.  Her EKG shows sinus bradycardia with sinus arrhythmia.  The sinus arrhythmia has been present since 07/01/2019.  She is only mildly bradycardic at 56.  There are some flattening of the ST segment in V2 but otherwise no ST or T wave abnormalities.  Given patient's physical exam presentation, and risk factors I feel she should be evaluated in the emergency department where she can have cardiac monitoring and serial cardiac labs.  I have ordered an IV to be placed in the patient to get 324 mg of aspirin.  We will transfer the patient to [facility] by EMS.  Report was called to Levada Dy, the charge nurse in the ER at [facility].  Report given to paramedics [place] with [facility].  Care transferred.   Final Clinical Impressions(s) / UC Diagnoses   Final diagnoses:  Atypical chest pain  Shortness of breath  Fatigue, unspecified type     Discharge Instructions      As we discussed, your symptoms and exam are concerning for a potential cardiac cause of your symptoms.  Therefore, I feel your best served to be evaluated in the emergency department.     ED Prescriptions   None    PDMP not reviewed this encounter.   Margarette Canada, NP 06/25/22 [phone]

## 2022-06-25 NOTE — ED Triage Notes (Signed)
Pt was brought to the ER via ems from [facility] urgent care, pt was treated with 379m asa and 453mzofran, pt has a 20g left forearm. Pt states she has been having intermittent pain since last pm, states that it feels like something is reaching through her back and grabbing and squeezing her heart. [facility]

## 2022-06-25 NOTE — ED Provider Notes (Signed)
[facility] Provider Note    Event Date/Time   First MD Initiated Contact with Patient 06/25/22 1024     (approximate)   History   Chief Complaint Chest Pain   HPI  Lisa Peters is a 42 y.o. female with past medical history of hyperlipidemia, GERD, ridge arthritis, migraines, and anxiety who presents to the ED complaining of chest pain.  Patient reports that she has been dealing with intermittent pain in her chest since last night associated with palpitations and shortness of breath.  She reports recent cough but has not had any fevers.  She has had intermittent swelling in her legs for at least the past month, but denies any pain or current swelling.  She felt like she might pass out last night and called EMS to her home.  She was told her vital signs were normal and she refused transport at that time.  She then presented to urgent care earlier today and was referred to the ED for further evaluation.  She describes the pain in her chest as a squeezing, also had an episode of burning discomfort in her upper abdomen last night moving up into her chest.     Physical Exam   Triage Vital Signs: ED Triage Vitals [06/25/22 0955]  Enc Vitals Group     BP 108/77     Pulse Rate 66     Resp 16     Temp 98.3 F (36.8 C)     Temp Source Oral     SpO2 99 %     Weight 107 lb (48.5 kg)     Height 5' 4"$  (1.626 m)     Head Circumference      Peak Flow      Pain Score 2     Pain Loc      Pain Edu?      Excl. in [place]?     Most recent vital signs: Vitals:   06/25/22 0955  BP: 108/77  Pulse: 66  Resp: 16  Temp: 98.3 F (36.8 C)  SpO2: 99%    Constitutional: Alert and oriented. Eyes: Conjunctivae are normal. Head: Atraumatic. Nose: No congestion/rhinnorhea. Mouth/Throat: Mucous membranes are moist.  Cardiovascular: Normal rate, regular rhythm. Grossly normal heart sounds.  2+ radial pulses bilaterally. Respiratory: Normal respiratory effort.  No  retractions. Lungs CTAB.  No chest wall tenderness to palpation. Gastrointestinal: Soft and nontender. No distention. Musculoskeletal: No lower extremity tenderness nor edema.  Neurologic:  Normal speech and language. No gross focal neurologic deficits are appreciated.    ED Results / Procedures / Treatments   Labs (all labs ordered are listed, but only abnormal results are displayed) Labs Reviewed  BASIC METABOLIC PANEL - Abnormal; Notable for the following components:      Result Value   Calcium 8.7 (*)    All other components within normal limits  CBC - Abnormal; Notable for the following components:   RBC 3.76 (*)    MCV 100.5 (*)    All other components within normal limits  TROPONIN I (HIGH SENSITIVITY)  TROPONIN I (HIGH SENSITIVITY)     EKG  ED ECG REPORT I, Blake Divine, the attending physician, personally viewed and interpreted this ECG.   Date: 06/25/2022  EKG Time: 9:57  Rate: 64  Rhythm: normal sinus rhythm  Axis: Normal  Intervals:none  ST&T Change: None  RADIOLOGY Chest x-ray reviewed and interpreted by me with no infiltrate, edema, or effusion.  PROCEDURES:  Critical Care  performed: No  .1-3 Lead EKG Interpretation  Performed by: Blake Divine, MD Authorized by: Blake Divine, MD     Interpretation: normal     ECG rate:  60-80   ECG rate assessment: normal     Rhythm: sinus rhythm     Ectopy: none     Conduction: normal      MEDICATIONS ORDERED IN ED: Medications - No data to display   IMPRESSION / MDM / [place] / ED COURSE  I reviewed the triage vital signs and the nursing notes.                              42 y.o. female with past medical history of hyperlipidemia, GERD, rheumatoid arthritis, migraines, and anxiety who presents to the ED complaining of intermittent squeezing pain in her chest was last night associated with palpitations and lightheadedness.  Patient's presentation is most consistent with acute  presentation with potential threat to life or bodily function.  Differential diagnosis includes, but is not limited to, arrhythmia, ACS, PE, dissection, pneumonia, pneumothorax, anemia, electrolyte abnormality, AKI, anxiety, GERD, musculoskeletal pain.  Patient well-appearing and in no acute distress, vital signs are unremarkable.  EKG shows no evidence of arrhythmia or ischemia and chest pain seems atypical.  Initial troponin is negative, we will observe on cardiac monitor and check second set troponin given her intermittent symptoms.  Low suspicion for PE as patient is PERC negative.  Chest x-ray is unremarkable and remainder of labs are reassuring with no significant anemia, leukocytosis, electrolyte abnormality, or AKI.  The patient is on the cardiac monitor to evaluate for evidence of arrhythmia and/or significant heart rate changes.  Repeat troponin within normal limits and no events noted on cardiac monitor.  Patient did describe some ongoing squeezing discomfort in her chest, but admits there seems to be a component of anxiety.  Given reassuring workup, patient is appropriate for discharge home, will provide with referral to cardiology for further evaluation of her palpitations.  She was counseled to return to the ED for new or worsening symptoms, patient agrees with plan.     FINAL CLINICAL IMPRESSION(S) / ED DIAGNOSES   Final diagnoses:  Nonspecific chest pain  Palpitations     Rx / DC Orders   ED Discharge Orders          Ordered    Ambulatory referral to Cardiology        06/25/22 1337             Note:  This document was prepared using Dragon voice recognition software and may include unintentional dictation errors.   Blake Divine, MD 06/25/22 1341

## 2022-06-25 NOTE — Discharge Instructions (Addendum)
As we discussed, your symptoms and exam are concerning for a potential cardiac cause of your symptoms.  Therefore, I feel your best served to be evaluated in the emergency department.

## 2022-06-25 NOTE — ED Notes (Signed)
.  UCTO

## 2022-06-25 NOTE — ED Triage Notes (Addendum)
Pt SOB, pt states she is really tired, lightheaded, nauseated, chest pains onset last night. Pt states she did call EMS and they did come out and did an EKG & checked her vitals. Pt states she was told EKG looked fine, BP was a little elevated. Pt states they did want to take her to ED but refused due to cost & states chest pains had subsided.   Pt does report if she does cough it does help pains go away in chest. Pt denies any chest pain at this times, reports pain comes at random times.

## 2022-06-25 NOTE — ED Notes (Signed)
Patient is being discharged from the Urgent Care and sent to the Emergency Department via EMS . Per Ysidro Evert Ryan,NP, patient is in need of higher level of care due to chest pain. Patient is aware and verbalizes understanding of plan of care.  Vitals:   06/25/22 0823  BP: 117/75  Pulse: 64  Temp: 97.9 F (36.6 C)  SpO2: 99%

## 2022-08-24 ENCOUNTER — Encounter: Payer: Self-pay | Admitting: Cardiology

## 2022-08-24 ENCOUNTER — Ambulatory Visit: Payer: No Typology Code available for payment source | Attending: Cardiology | Admitting: Cardiology

## 2022-10-27 ENCOUNTER — Ambulatory Visit: Payer: No Typology Code available for payment source | Admitting: Cardiology

## 2023-04-12 ENCOUNTER — Ambulatory Visit
Admission: EM | Admit: 2023-04-12 | Discharge: 2023-04-12 | Disposition: A | Discharge status: Home / Self Care | Payer: No Typology Code available for payment source | Attending: Physician Assistant | Admitting: Physician Assistant

## 2023-04-12 ENCOUNTER — Telehealth: Payer: Self-pay

## 2023-04-12 DIAGNOSIS — R591 Generalized enlarged lymph nodes: Secondary | ICD-10-CM | POA: Diagnosis not present

## 2023-04-12 LAB — CBC WITH DIFFERENTIAL/PLATELET
Abs Immature Granulocytes: 0.01 10*3/uL (ref 0.00–0.07)
Basophils Absolute: 0.1 10*3/uL (ref 0.0–0.1)
Basophils Relative: 1 %
Eosinophils Absolute: 0.1 10*3/uL (ref 0.0–0.5)
Eosinophils Relative: 1 %
HCT: 39.1 % (ref 36.0–46.0)
Hemoglobin: 13.2 g/dL (ref 12.0–15.0)
Immature Granulocytes: 0 %
Lymphocytes Relative: 27 %
Lymphs Abs: 1.7 10*3/uL (ref 0.7–4.0)
MCH: 33.4 pg (ref 26.0–34.0)
MCHC: 33.8 g/dL (ref 30.0–36.0)
MCV: 99 fL (ref 80.0–100.0)
Monocytes Absolute: 0.6 10*3/uL (ref 0.1–1.0)
Monocytes Relative: 9 %
Neutro Abs: 4 10*3/uL (ref 1.7–7.7)
Neutrophils Relative %: 62 %
Platelets: 416 10*3/uL — ABNORMAL HIGH (ref 150–400)
RBC: 3.95 MIL/uL (ref 3.87–5.11)
RDW: 12.7 % (ref 11.5–15.5)
WBC: 6.4 10*3/uL (ref 4.0–10.5)
nRBC: 0 % (ref 0.0–0.2)

## 2023-04-12 LAB — MONONUCLEOSIS SCREEN: Mono Screen: NEGATIVE

## 2023-04-12 LAB — GROUP A STREP BY PCR: Group A Strep by PCR: NOT DETECTED

## 2023-04-12 NOTE — ED Triage Notes (Signed)
Pt presents to UC c/o RT side of throat pain x about x1 month. Pt states she can feel it when she swallow, pt feels lymph nodes swollen. Pt states worse at night.

## 2023-04-12 NOTE — Telephone Encounter (Signed)
Called patient and notified her of negative mono and normal CBC per Cathlean Cower B. Michiel Cowboy, [place]. Pt also aware to contact PCP if lymph node swelling does not go down in a couple weeks to consider [place] of neck. Pt aware and voiced understanding to all.

## 2023-04-12 NOTE — ED Provider Notes (Signed)
[facility]    CSN: [id number] Arrival date & time: 04/12/23  1233      History   Chief Complaint Chief Complaint  Patient presents with   Sore Throat    HPI Lisa Peters is a 42 y.o. female presenting for approximately 1 month history of swollen lymph nodes mostly of the right side of her neck.  She reports a lump on the right anterior side and also "tiny lumps" on the back of her neck.  It has not gotten better or worse from onset.  Increased discomfort with swallowing but denies sore throat being sore.  She denies ever having any URI type symptoms before onset of lymphadenopathy.  Denies fever, fatigue, cough, congestion, sore throat, sinus pain, ear pain, chest pain, shortness of breath.  No OTC meds taken.  No sick contacts.  No other complaints.  Medical history significant for rheumatoid arthritis, ankylosing spondylitis, chronic pain, GERD, migraines.   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    Rheumatoid arthritis (HCC)    Rheumatoid    Patient Active Problem List   Diagnosis Date Noted   S/P laparoscopy    History of cellulitis 11/08/2019   History of fall 08/18/2018   Closed nondisplaced fracture of head of left radius 07/01/2018   S/P laparoscopic hysterectomy 04/14/2018   Esophagitis 02/14/2018   Hiatal hernia 02/14/2018   Adenomyosis 02/02/2018   Chronic pelvic pain in female 02/02/2018   Abnormal uterine bleeding 02/02/2018   Vitamin D deficiency 09/06/2017   Anxiety 08/17/2017   Allergic rhinitis 08/17/2017   Epigastric pain 05/12/2017   Fatigue 05/12/2017   Gastroesophageal reflux disease without esophagitis 05/12/2017   Psychophysiological insomnia 05/12/2017   Ankylosing spondylitis of multiple sites in spine (HCC) 02/23/2017   Hyperlipidemia, mixed 06/12/2016   Long term current use of  non-steroidal anti-inflammatories (NSAID) 05/25/2016   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/24/2015   Chronic neck pain 12/24/2015   High risk medication use 12/24/2015   HLA B27 (HLA B27 positive) 12/24/2015   Migraine without status migrainosus, not intractable 12/24/2015   Morning stiffness of joints 12/24/2015   Numbness and tingling in both hands 12/24/2015   Pain of both elbows 12/24/2015   Risk for falls 12/24/2015   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   Poor concentration 03/22/2015   Palpitations 03/22/2015    Past Surgical History:  Procedure Laterality Date   ABDOMINAL HYSTERECTOMY  2019   COLPOSCOPY     CYSTOSCOPY N/A 04/14/2018   Procedure: CYSTOSCOPY;  Surgeon: Vena Austria, MD;  Location: [facility] ORS;  Service: Gynecology;  Laterality: N/A;   FINGER SURGERY  2014   LAPAROSCOPIC HYSTERECTOMY N/A 04/14/2018   Procedure: HYSTERECTOMY TOTAL LAPAROSCOPIC BILATERAL SALPINGECTOMY;  Surgeon: Vena Austria, MD;  Location: [facility] OR[facility];  Service: Gynecology;  Laterality: N/A;   SALPINGOOPHORECTOMY      OB History     Gravida  3   Para  3   Term  3   Preterm      AB      Living  3      SAB      IAB      Ectopic      Multiple      Live Births  3  Home Medications    Prior to Admission medications   Medication Sig Start Date End Date Taking? Authorizing Provider  celecoxib (CELEBREX) 100 MG capsule Take 1 capsule by mouth 2 (two) times daily. 03/17/23  Yes [provider]  pantoprazole (PROTONIX) 40 MG tablet Take 1 tablet (40 mg total) by mouth daily. 08/17/17  Yes Duanne Limerick, MD  Adalimumab (HUMIRA PEN) 40 MG/0.4ML PNKT Inject into the skin. 09/12/20   [provider]  ALPRAZolam Prudy Feeler) 0.5 MG tablet Take 0.5 mg by mouth daily as needed. 08/20/20   [provider]  Cholecalciferol (VITAMIN D) 2000 units CAPS Take 2,000 Units by mouth daily.    [provider]  clindamycin  (CLEOCIN T) 1 % lotion Apply topically every morning. Patient not taking: Reported on 04/11/2021 09/13/20   [provider]  ergocalciferol (VITAMIN D2) 1.25 MG (50000 UT) capsule Take 1 capsule by mouth every 7 (seven) days. Patient not taking: Reported on 04/11/2021 07/26/20   [provider]  ibuprofen (ADVIL) 800 MG tablet Take 1 tablet (800 mg total) by mouth every 8 (eight) hours as needed for moderate pain. 04/23/20   Vena Austria, MD  mirtazapine (REMERON) 15 MG tablet Take 15 mg by mouth at bedtime. Patient not taking: Reported on 04/11/2021    [provider]  tretinoin (RETIN-A) 0.025 % cream Apply topically. Patient not taking: Reported on 04/11/2021 09/13/20   [provider]    Family History Family History  Problem Relation Age of Onset   Diabetes Mother    Arthritis Father    Diabetes Father        hypoglycemia   Stroke Paternal Grandmother    Heart disease Paternal Grandfather    Breast cancer Neg Hx     Social History Social History   Tobacco Use   Smoking status: Every Day    Current packs/day: 0.50    Average packs/day: 0.5 packs/day for 20.0 years (10.0 ttl pk-yrs)    Types: Cigarettes   Smokeless tobacco: Never  Vaping Use   Vaping status: Some Days   Substances: Nicotine  Substance Use Topics   Alcohol use: Yes    Comment: occassionally   Drug use: No     Allergies   Sulfamethoxazole-trimethoprim and Sulfa antibiotics   Review of Systems Review of Systems  Constitutional:  Negative for chills, diaphoresis, fatigue and fever.  HENT:  Negative for congestion, ear discharge, ear pain, rhinorrhea, sinus pressure, sinus pain, sore throat and trouble swallowing.   Respiratory:  Negative for cough and shortness of breath.   Cardiovascular:  Negative for chest pain.  Gastrointestinal:  Negative for abdominal pain, nausea and vomiting.  Musculoskeletal:  Negative for arthralgias and myalgias.  Skin:  Negative for  rash.  Neurological:  Negative for dizziness, weakness, numbness and headaches.  Hematological:  Positive for adenopathy.     Physical Exam Triage Vital Signs ED Triage Vitals  Encounter Vitals Group     BP 04/12/23 1438 129/75     Systolic BP Percentile --      Diastolic BP Percentile --      Pulse Rate 04/12/23 1438 85     Resp --      Temp 04/12/23 1438 98.3 F (36.8 C)     Temp Source 04/12/23 1438 Oral     SpO2 04/12/23 1438 99 %     Weight --      Height --      Head Circumference --  Peak Flow --      Pain Score 04/12/23 1437 3     Pain Loc --      Pain Education --      Exclude from Growth Chart --    No data found.  Updated Vital Signs BP 129/75 (BP Location: Right Arm)   Pulse 85   Temp 98.3 F (36.8 C) (Oral)   LMP  (LMP Unknown)   SpO2 99%      Physical Exam Vitals and nursing note reviewed.  Constitutional:      General: She is not in acute distress.    Appearance: Normal appearance. She is not ill-appearing or toxic-appearing.  HENT:     Head: Normocephalic and atraumatic.     Right Ear: Tympanic membrane, ear canal and external ear normal.     Left Ear: Tympanic membrane, ear canal and external ear normal.     Nose: Nose normal.     Mouth/Throat:     Mouth: Mucous membranes are moist.     Pharynx: Oropharynx is clear. No posterior oropharyngeal erythema.  Eyes:     General: No scleral icterus.       Right eye: No discharge.        Left eye: No discharge.     Conjunctiva/sclera: Conjunctivae normal.  Neck:     Comments: There are multiple areas of lymphadenopathy of the right anterior and posterior cervical chains and mild left-sided anterior cervical lymphadenopathy. Cardiovascular:     Rate and Rhythm: Normal rate and regular rhythm.     Heart sounds: Normal heart sounds.  Pulmonary:     Effort: Pulmonary effort is normal. No respiratory distress.     Breath sounds: Normal breath sounds.  Musculoskeletal:     Cervical back: Neck  supple.  Lymphadenopathy:     Cervical: Cervical adenopathy present.  Skin:    General: Skin is dry.  Neurological:     General: No focal deficit present.     Mental Status: She is alert. Mental status is at baseline.     Motor: No weakness.     Gait: Gait normal.  Psychiatric:        Mood and Affect: Mood normal.        Behavior: Behavior normal.        Thought Content: Thought content normal.      UC Treatments / Results  Labs (all labs ordered are listed, but only abnormal results are displayed) Labs Reviewed  CBC WITH DIFFERENTIAL/PLATELET - Abnormal; Notable for the following components:      Result Value   Platelets 416 (*)    All other components within normal limits  GROUP A STREP BY PCR  MONONUCLEOSIS SCREEN    EKG   Radiology No results found.  Procedures Procedures (including critical care time)  Medications Ordered in UC Medications - No data to display  Initial Impression / Assessment and Plan / UC Course  I have reviewed the triage vital signs and the nursing notes.  Pertinent labs & imaging results that were available during my care of the patient were reviewed by me and considered in my medical decision making (see chart for details).   42 year old female presents for lymphadenopathy of neck x 1 month.  Reports increased discomfort with swallowing but denies sore throat.  No associated fever or URI symptoms.  Medical history is significant for autoimmune disease and she is also a everyday smoker.  Vitals are normal and stable and she is overall well-appearing.  She has lymphadenopathy of bilateral anterior cervical lymph node chains but especially on the right side and right posterior cervical lymphadenopathy.  The remainder of the exam is normal.  Strep negative.  Mono and CBC obtained.  Reviewed CBC from August 2024 which did not show any significant abnormalities.  Mono is negative and the CBC is normal. No concerning labs.  Results reviewed  with patient by nursing staff.  Advised to make follow up with PCP if lymph node swelling is not going down in a couple of weeks or if it worsens. May need [place] of neck.   Final Clinical Impressions(s) / UC Diagnoses   Final diagnoses:  Lymphadenopathy of head and neck     Discharge Instructions      -Strep test negative - We are checking for mono and also doing a blood count.  I will contact you with these results. -You may need to have an ultrasound of your neck ordered through PCP if they are not going down in the next couple weeks or if you have any significantly abnormal lab results. - If you develop a fever or acute worsening symptoms you should go to the ER.     ED Prescriptions   None    PDMP not reviewed this encounter.   Shirlee Latch, PA-C 04/12/23 1622

## 2023-04-12 NOTE — Discharge Instructions (Addendum)
-  Strep test negative - We are checking for mono and also doing a blood count.  I will contact you with these results. -You may need to have an ultrasound of your neck ordered through PCP if they are not going down in the next couple weeks or if you have any significantly abnormal lab results. - If you develop a fever or acute worsening symptoms you should go to the ER.

## 2023-12-13 ENCOUNTER — Ambulatory Visit

## 2023-12-13 ENCOUNTER — Ambulatory Visit
Admission: EM | Admit: 2023-12-13 | Discharge: 2023-12-13 | Disposition: A | Discharge status: Home / Self Care | Attending: Emergency Medicine | Admitting: Emergency Medicine

## 2023-12-13 ENCOUNTER — Encounter: Payer: Self-pay | Admitting: Emergency Medicine

## 2023-12-13 DIAGNOSIS — R1013 Epigastric pain: Secondary | ICD-10-CM | POA: Diagnosis not present

## 2023-12-13 DIAGNOSIS — R1906 Epigastric swelling, mass or lump: Secondary | ICD-10-CM | POA: Insufficient documentation

## 2023-12-13 LAB — CBC WITH DIFFERENTIAL/PLATELET
Abs Immature Granulocytes: 0.03 K/uL (ref 0.00–0.07)
Basophils Absolute: 0.1 K/uL (ref 0.0–0.1)
Basophils Relative: 1 %
Eosinophils Absolute: 0.1 K/uL (ref 0.0–0.5)
Eosinophils Relative: 2 %
HCT: 38.4 % (ref 36.0–46.0)
Hemoglobin: 13.2 g/dL (ref 12.0–15.0)
Immature Granulocytes: 0 %
Lymphocytes Relative: 16 %
Lymphs Abs: 1.3 K/uL (ref 0.7–4.0)
MCH: 33.3 pg (ref 26.0–34.0)
MCHC: 34.4 g/dL (ref 30.0–36.0)
MCV: 97 fL (ref 80.0–100.0)
Monocytes Absolute: 0.6 K/uL (ref 0.1–1.0)
Monocytes Relative: 8 %
Neutro Abs: 5.6 K/uL (ref 1.7–7.7)
Neutrophils Relative %: 73 %
Platelets: 393 K/uL (ref 150–400)
RBC: 3.96 MIL/uL (ref 3.87–5.11)
RDW: 12.5 % (ref 11.5–15.5)
WBC: 7.7 K/uL (ref 4.0–10.5)
nRBC: 0 % (ref 0.0–0.2)

## 2023-12-13 LAB — COMPREHENSIVE METABOLIC PANEL WITH GFR
ALT: 16 U/L (ref 0–44)
AST: 22 U/L (ref 15–41)
Albumin: 4.7 g/dL (ref 3.5–5.0)
Alkaline Phosphatase: 53 U/L (ref 38–126)
Anion gap: 13 (ref 5–15)
BUN: 11 mg/dL (ref 6–20)
CO2: 24 mmol/L (ref 22–32)
Calcium: 9.3 mg/dL (ref 8.9–10.3)
Chloride: 101 mmol/L (ref 98–111)
Creatinine, Ser: 0.81 mg/dL (ref 0.44–1.00)
GFR, Estimated: >60 mL/min (ref >60)
Glucose, Bld: 62 mg/dL — ABNORMAL LOW (ref 70–99)
Potassium: 3.9 mmol/L (ref 3.5–5.1)
Sodium: 138 mmol/L (ref 135–145)
Total Bilirubin: 0.8 mg/dL (ref 0.0–1.2)
Total Protein: 7.6 g/dL (ref 6.5–8.1)

## 2023-12-13 LAB — URINALYSIS, W/ REFLEX TO CULTURE (INFECTION SUSPECTED)
Glucose, UA: NEGATIVE mg/dL
Ketones, ur: >160 mg/dL — AB
Leukocytes,Ua: NEGATIVE
Nitrite: NEGATIVE
Specific Gravity, Urine: >1.03 — ABNORMAL HIGH (ref 1.005–1.030)
pH: 5.5 (ref 5.0–8.0)

## 2023-12-13 LAB — SARS CORONAVIRUS 2 BY RT PCR: SARS Coronavirus 2 by RT PCR: NEGATIVE

## 2023-12-13 LAB — GROUP A STREP BY PCR: Group A Strep by PCR: NOT DETECTED

## 2023-12-13 LAB — LIPASE, BLOOD: Lipase: 30 U/L (ref 11–51)

## 2023-12-13 NOTE — Discharge Instructions (Addendum)
 Please go to the emergency department at [facility] to have a CT scan to evaluate the mass in your abdomen.  This could be contributing to your nausea, weight loss, decreased appetite.  Your ultrasound was indeterminant as to the source of the mass.

## 2023-12-13 NOTE — ED Triage Notes (Signed)
 Pt presents with a cough, headache, bodyaches, and nausea x 3 days. Pt has not taken any medication OTC to help with symptoms.  She also c/o vaginal irritation, discharge and urinary frequency for the past month. She was self treating for BV and yeast.

## 2023-12-13 NOTE — ED Provider Notes (Signed)
 [facility]    CSN: [id number] Arrival date & time: 12/13/23  9147      History   Chief Complaint Chief Complaint  Patient presents with   Generalized Body Aches   Headache   Nausea   Vaginal Itching   Cough    HPI Lisa Peters is a 43 y.o. female.   HPI  43 year old female with past medical history significant for rheumatoid arthritis, GERD, PTSD, anxiety, depression, hyperlipidemia, and chronic pain presents for evaluation of a multitude of complaints.  She reports that for the last 3 days she has had a headache, body aches, nausea, and a productive cough for yellow sputum.  She also reports that she has had a sore throat for a while.  Additionally, she is endorsing some ear pain.  The patient has also been experiencing some vaginal irritation that she thought was BV that she self treated.  Now she is experiencing vaginal burning, no longer has a discharge, and reports that her vulva is red.  She also has dysuria, urgency, and frequency.  No hematuria.  She does endorse low back pain.  She has not had any fevers and she denies shortness breath or wheezing.  She does have generalized abdominal pain and reports that she has been experiencing night sweats, had a 5 pound weight loss since July 4, and has had a decreased appetite.  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    Rheumatoid arthritis (HCC)    Rheumatoid    Patient Active Problem List   Diagnosis Date Noted   S/P laparoscopy    History of cellulitis 11/08/2019   History of fall 08/18/2018   Closed nondisplaced fracture of head of left radius 07/01/2018   S/P laparoscopic hysterectomy 04/14/2018   Esophagitis 02/14/2018   Hiatal hernia 02/14/2018   Adenomyosis 02/02/2018   Chronic pelvic pain in female 02/02/2018   Abnormal uterine bleeding 02/02/2018    Vitamin D deficiency 09/06/2017   Anxiety 08/17/2017   Allergic rhinitis 08/17/2017   Epigastric pain 05/12/2017   Fatigue 05/12/2017   Gastroesophageal reflux disease without esophagitis 05/12/2017   Psychophysiological insomnia 05/12/2017   Ankylosing spondylitis of multiple sites in spine (HCC) 02/23/2017   Hyperlipidemia, mixed 06/12/2016   Long term current use of non-steroidal anti-inflammatories (NSAID) 05/25/2016   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/24/2015   Chronic neck pain 12/24/2015   High risk medication use 12/24/2015   HLA B27 (HLA B27 positive) 12/24/2015   Migraine without status migrainosus, not intractable 12/24/2015   Morning stiffness of joints 12/24/2015   Numbness and tingling in both hands 12/24/2015   Pain of both elbows 12/24/2015   Risk for falls 12/24/2015   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   Poor concentration 03/22/2015   Palpitations 03/22/2015    Past Surgical History:  Procedure Laterality Date   ABDOMINAL HYSTERECTOMY  2019   COLPOSCOPY     CYSTOSCOPY N/A 04/14/2018   Procedure: CYSTOSCOPY;  Surgeon: Lake Read, MD;  Location: [facility];  Service: Gynecology;  Laterality: N/A;   FINGER SURGERY  2014   LAPAROSCOPIC HYSTERECTOMY N/A 04/14/2018   Procedure: HYSTERECTOMY TOTAL LAPAROSCOPIC BILATERAL SALPINGECTOMY;  Surgeon: Lake Read, MD;  Location: [facility];  Service: Gynecology;  Laterality: N/A;   SALPINGOOPHORECTOMY      OB History  Gravida  3   Para  3   Term  3   Preterm      AB      Living  3      SAB      IAB      Ectopic      Multiple      Live Births  3            Home Medications    Prior to Admission medications   Medication Sig Start Date End Date Taking? Authorizing Provider  etanercept (ENBREL) 50 MG/ML injection Inject 50 mg into the skin. 07/28/23  Yes [provider]  Adalimumab (HUMIRA PEN) 40 MG/0.4ML PNKT Inject into the skin. 09/12/20    [provider]  ALPRAZolam  (XANAX ) 0.5 MG tablet Take 0.5 mg by mouth daily as needed. 08/20/20   [provider]  celecoxib (CELEBREX) 100 MG capsule Take 1 capsule by mouth 2 (two) times daily. 03/17/23   [provider]  Cholecalciferol (VITAMIN D) 2000 units CAPS Take 2,000 Units by mouth daily.    [provider]  clindamycin (CLEOCIN T) 1 % lotion Apply topically every morning. Patient not taking: Reported on 04/11/2021 09/13/20   [provider]  ergocalciferol (VITAMIN D2) 1.25 MG (50000 UT) capsule Take 1 capsule by mouth every 7 (seven) days. Patient not taking: Reported on 04/11/2021 07/26/20   [provider]  ibuprofen  (ADVIL ) 800 MG tablet Take 1 tablet (800 mg total) by mouth every 8 (eight) hours as needed for moderate pain. 04/23/20   Lake Read, MD  mirtazapine  (REMERON ) 15 MG tablet Take 15 mg by mouth at bedtime. Patient not taking: Reported on 04/11/2021    [provider]  pantoprazole  (PROTONIX ) 40 MG tablet Take 1 tablet (40 mg total) by mouth daily. 08/17/17   Jones, Deanna C, MD  tretinoin (RETIN-A) 0.025 % cream Apply topically. Patient not taking: Reported on 04/11/2021 09/13/20   [provider]    Family History Family History  Problem Relation Age of Onset   Diabetes Mother    Arthritis Father    Diabetes Father        hypoglycemia   Stroke Paternal Grandmother    Heart disease Paternal Grandfather    Breast cancer Neg Hx     Social History Social History   Tobacco Use   Smoking status: Every Day    Current packs/day: 0.50    Average packs/day: 0.5 packs/day for 20.0 years (10.0 ttl pk-yrs)    Types: Cigarettes   Smokeless tobacco: Never  Vaping Use   Vaping status: Former   Substances: Nicotine  Substance Use Topics   Alcohol use: Yes    Comment: occassionally   Drug use: No     Allergies   Sulfamethoxazole -trimethoprim  and Sulfa  antibiotics   Review of Systems Review  of Systems  Constitutional:  Positive for appetite change, diaphoresis and unexpected weight change. Negative for fever.  HENT:  Positive for congestion, ear pain, rhinorrhea and sore throat.   Respiratory:  Positive for cough. Negative for shortness of breath and wheezing.   Gastrointestinal:  Positive for nausea. Negative for diarrhea and vomiting.  Genitourinary:  Positive for dysuria, frequency, urgency, vaginal discharge and vaginal pain. Negative for hematuria.  Musculoskeletal:  Positive for back pain.  Skin:  Negative for rash.  Hematological:  Positive for adenopathy.       Inguinal.     Physical Exam Triage Vital Signs ED Triage Vitals  Encounter  Vitals Group     BP      Girls Systolic BP Percentile      Girls Diastolic BP Percentile      Boys Systolic BP Percentile      Boys Diastolic BP Percentile      Pulse      Resp      Temp      Temp src      SpO2      Weight      Height      Head Circumference      Peak Flow      Pain Score      Pain Loc      Pain Education      Exclude from Growth Chart    No data found.  Updated Vital Signs BP 105/74 (BP Location: Left Arm)   Pulse 78   Temp 99.4 F (37.4 C) (Oral)   LMP  (LMP Unknown)   SpO2 100%   Visual Acuity Right Eye Distance:   Left Eye Distance:   Bilateral Distance:    Right Eye Near:   Left Eye Near:    Bilateral Near:     Physical Exam Vitals and nursing note reviewed.  Constitutional:      Appearance: Normal appearance.  HENT:     Head: Normocephalic and atraumatic.     Right Ear: Tympanic membrane, ear canal and external ear normal. There is no impacted cerumen.     Left Ear: Tympanic membrane, ear canal and external ear normal. There is no impacted cerumen.     Nose: Congestion and rhinorrhea present.     Comments: This mucosa is erythematous, edematous, with scant clear discharge in both nares.    Mouth/Throat:     Mouth: Mucous membranes are moist.     Pharynx: Oropharyngeal exudate  and posterior oropharyngeal erythema present.     Comments: Tonsillar pillars are 1+ edematous, erythematous, with white exudate. Neck:     Comments: Bilateral tender, anterior cervical lymphadenopathy present. Cardiovascular:     Rate and Rhythm: Normal rate and regular rhythm.     Pulses: Normal pulses.     Heart sounds: Normal heart sounds. No murmur heard.    No friction rub. No gallop.  Pulmonary:     Effort: Pulmonary effort is normal.     Breath sounds: Normal breath sounds. No wheezing, rhonchi or rales.  Abdominal:     General: Abdomen is flat.     Palpations: Abdomen is soft. There is mass.     Tenderness: There is abdominal tenderness. There is no right CVA tenderness, left CVA tenderness, guarding or rebound.     Comments: Generalized abdominal tenderness.  Ovoid mass in epigastrium.  Musculoskeletal:     Cervical back: Normal range of motion and neck supple. Tenderness present.  Lymphadenopathy:     Cervical: Cervical adenopathy present.  Skin:    General: Skin is warm and dry.     Capillary Refill: Capillary refill takes less than 2 seconds.     Findings: No bruising, erythema or rash.  Neurological:     General: No focal deficit present.     Mental Status: She is oriented to person, place, and time.      UC Treatments / Results  Labs (all labs ordered are listed, but only abnormal results are displayed) Labs Reviewed  URINALYSIS, W/ REFLEX TO CULTURE (INFECTION SUSPECTED) - Abnormal; Notable for the following components:      Result Value  APPearance CLOUDY (*)    Specific Gravity, Urine >1.030 (*)    Hgb urine dipstick MODERATE (*)    Bilirubin Urine SMALL (*)    Ketones, ur >160 (*)    Protein, ur TRACE (*)    Bacteria, UA MANY (*)    All other components within normal limits  COMPREHENSIVE METABOLIC PANEL WITH GFR - Abnormal; Notable for the following components:   Glucose, Bld 62 (*)    All other components within normal limits  SARS CORONAVIRUS 2  BY RT PCR  GROUP A STREP BY PCR  CBC WITH DIFFERENTIAL/PLATELET  LIPASE, BLOOD  CERVICOVAGINAL ANCILLARY ONLY    EKG   Radiology US  Abdomen Limited Result Date: 12/13/2023 CLINICAL DATA:  Tender palpable mass felt over the epigastric region by patient's healthcare provider. Patient not feeling well over the past 3 days. EXAM: ULTRASOUND ABDOMEN LIMITED COMPARISON:  Abdominal ultrasound 01/25/2018 and CT abdomen 04/11/2020 FINDINGS: Ultrasound evaluation over the mid abdomen in the area of patient's palpable clinical concern demonstrates an oval soft tissue mass-like area just anterior to the aorta measuring approximately 1.6 x 4.9 x 7.2 cm in AP, transverse and longitudinal dimensions. Origin of this masslike area is unclear. Underlying aorta is normal. IMPRESSION: Indeterminate 7.2 cm oval soft tissue mass-like area over the mid abdomen just anterior to the aorta. Recommend further evaluation with CT of the abdomen and pelvis with IV and oral contrast. Electronically Signed   By: Toribio Agreste M.D.   On: 12/13/2023 10:51    Procedures Procedures (including critical care time)  Medications Ordered in UC Medications - No data to display  Initial Impression / Assessment and Plan / UC Course  I have reviewed the triage vital signs and the nursing notes.  Pertinent labs & imaging results that were available during my care of the patient were reviewed by me and considered in my medical decision making (see chart for details).   Patient is a pleasant 43 year old female presenting for evaluation of a multitude of symptoms as outlined in HPI above.  With regards to her upper respiratory symptoms she does have edematous and erythematous tonsillar pillars with white exudate.  Posterior pharynx is also erythematous.  Anterior cervical lymphadenopathy is present.  I will order a strep PCR.  She is also experiencing nasal congestion with inflamed nasal mucosa and clear nasal discharge.  Which could  possibly be COVID, influenza, or viral respiratory illness.  I will order a COVID PCR given that she has had symptoms for 3 days.  The patient's cardiopulmonary dam is benign.  Her abdomen is soft and flat with generalized abdominal tenderness but no guarding.  There is an ovoid mass in the epigastrium.  Patient reports that she has inguinal lymphadenopathy as well, which I do appreciate.  After palpating the patient's abdomen I did inquire about the patient's appetite which she reports has been decreased and she also reports a 5 pound weight loss in the last 30 days.  She does report that her weight and appetite to go up and down and that is not unusual for her.  She has been experiencing night sweats.  Differential diagnosis include pancreatitis, mesenteric adenitis, pancreatic mass.  I will order a CBC, CMP, lipase, and complete abdominal ultrasound given that we do not have CT available for us  today and urgent care.  COVID PCR is negative.  Urinalysis has a cloudy appearance with a high specific gravity, moderate hemoglobin, small bilirubin, greater than 160 ketones, and trace protein.  Negative for leukocyte esterase or nitrites.  Reflex microscopy shows significant skin contamination with 21-50 squamous epithelials, 11-20 WBCs, 6-10 RBCs, and many bacteria.  CBC shows a normal white count of 7.7.  H&H is 13.2 and 38.4.  Platelets 393.  No abnormalities to the differential.  CMP shows normal electrolytes, renal function, and transaminases.  Glucose is low at 62.  Lipase is normal at 38.  Strep PCR is negative.  Radiology impression of ultrasound states there is a indeterminate 7.2 cm oval soft tissue masslike area over the mid abdomen just anterior to the aorta.  Recommend further evaluation with CT of the abdomen pelvis.  I discussed the findings of the ultrasound with the patient and her significant other and that the radiologist is recommending a CT scan.  I have therefore suggested she  follow-up in the emergency department.  She has elected to go to [facility].  I have advised her not to eat or drink until after she been evaluated in the ER.   Final Clinical Impressions(s) / UC Diagnoses   Final diagnoses:  Epigastric pain  Epigastric mass     Discharge Instructions      Please go to the emergency department at [facility] to have a CT scan to evaluate the mass in your abdomen.  This could be contributing to your nausea, weight loss, decreased appetite.  Your ultrasound was indeterminant as to the source of the mass.     ED Prescriptions   None    PDMP not reviewed this encounter.   Bernardino Ditch, NP 12/13/23 1106

## 2023-12-14 LAB — CERVICOVAGINAL ANCILLARY ONLY
Bacterial Vaginitis (gardnerella): POSITIVE — AB
Candida Glabrata: NEGATIVE
Candida Vaginitis: NEGATIVE
Chlamydia: NEGATIVE
Comment: NEGATIVE
Comment: NEGATIVE
Comment: NEGATIVE
Comment: NEGATIVE
Comment: NEGATIVE
Comment: NORMAL
Neisseria Gonorrhea: NEGATIVE
Trichomonas: NEGATIVE

## 2023-12-15 ENCOUNTER — Ambulatory Visit (HOSPITAL_COMMUNITY): Payer: Self-pay

## 2023-12-15 MED ORDER — METRONIDAZOLE 500 MG PO TABS
500.0000 mg | ORAL_TABLET | Freq: Two times a day (BID) | ORAL | 0 refills | Status: AC
Start: 2023-12-15 — End: 2023-12-22

## 2024-02-08 ENCOUNTER — Encounter: Payer: Self-pay | Admitting: Infectious Diseases

## 2024-02-08 ENCOUNTER — Ambulatory Visit: Attending: Infectious Diseases | Admitting: Infectious Diseases

## 2024-02-08 VITALS — BP 118/83 | HR 69 | Temp 97.9°F | Ht 64.0 in | Wt 108.0 lb

## 2024-02-08 DIAGNOSIS — M459 Ankylosing spondylitis of unspecified sites in spine: Secondary | ICD-10-CM | POA: Diagnosis not present

## 2024-02-08 DIAGNOSIS — Z79899 Other long term (current) drug therapy: Secondary | ICD-10-CM | POA: Insufficient documentation

## 2024-02-08 DIAGNOSIS — A4901 Methicillin susceptible Staphylococcus aureus infection, unspecified site: Secondary | ICD-10-CM

## 2024-02-08 DIAGNOSIS — Z79620 Long term (current) use of immunosuppressive biologic: Secondary | ICD-10-CM

## 2024-02-08 DIAGNOSIS — M0600 Rheumatoid arthritis without rheumatoid factor, unspecified site: Secondary | ICD-10-CM | POA: Insufficient documentation

## 2024-02-08 DIAGNOSIS — F17210 Nicotine dependence, cigarettes, uncomplicated: Secondary | ICD-10-CM | POA: Insufficient documentation

## 2024-02-08 DIAGNOSIS — Z716 Tobacco abuse counseling: Secondary | ICD-10-CM | POA: Insufficient documentation

## 2024-02-08 DIAGNOSIS — B9561 Methicillin susceptible Staphylococcus aureus infection as the cause of diseases classified elsewhere: Secondary | ICD-10-CM | POA: Insufficient documentation

## 2024-02-08 DIAGNOSIS — D849 Immunodeficiency, unspecified: Secondary | ICD-10-CM | POA: Diagnosis not present

## 2024-02-08 MED ORDER — CHLORHEXIDINE GLUCONATE 4 % EX SOLN: Freq: Every day | CUTANEOUS | 1 refills | Status: AC

## 2024-02-08 MED ORDER — MUPIROCIN 2 % EX OINT: 1.0000 | TOPICAL_OINTMENT | Freq: Two times a day (BID) | CUTANEOUS | 0 refills | Status: AC

## 2024-02-08 NOTE — Progress Notes (Signed)
 NAME: Lisa Peters  DOB: 15-Aug-1980  MRN: [id number]  Date/Time: 02/08/2024 8:57 AM    ?pt is here with her partner- agreed to the use of AI scribe  SARIN COMUNALE is a 43 year old female with seronegative rheumatoid arthritis and ankylosing spondylitis who presents with recurrent staphylococcal skin infections. She was referred by  Selinda Quan, PA, for evaluation of recurrent skin infections.  In February, she developed a painful sore on her left shin which eventually healed. A culture at that time taken at [facility] revealed Staphylococcus aureus, not MRSA. She was never informed of the result. Recently, she experienced similar sores on her hip and vagina, which resolved after two rounds of clindamycin. No recent tooth extractions and no known diabetes.  She is currently taking Enbrel for rheumatoid arthritis and ankylosing spondylitis, which she restarted recently after a period of discontinuation. She has been on Enbrel for about a year, having switched from Humira due to lack of efficacy. She has also been on methotrexate in the past but discontinued it due to adverse effects. Her other medications include Celebrex and a probiotic. She is allergic to sulfa  drugs.  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 She is a smoker but has reduced her smoking significantly.  She has a family history of psoriasis in her brother and diabetes in her mother. No personal history of psoriasis or eczema, though she occasionally experiences acne on her face.  She is concerned about the risk of transmission to her family, which includes her boyfriend and three children, but none have shown symptoms of infection.  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    Rheumatoid arthritis (HCC)     Rheumatoid    Past Surgical History:  Procedure Laterality Date   ABDOMINAL HYSTERECTOMY  2019   COLPOSCOPY     CYSTOSCOPY N/A 04/14/2018   Procedure: CYSTOSCOPY;  Surgeon: Lake Read, MD;  Location: [facility];  Service: Gynecology;  Laterality: N/A;   FINGER SURGERY  2014   LAPAROSCOPIC HYSTERECTOMY N/A 04/14/2018   Procedure: HYSTERECTOMY TOTAL LAPAROSCOPIC BILATERAL SALPINGECTOMY;  Surgeon: Lake Read, MD;  Location: [facility];  Service: Gynecology;  Laterality: N/A;   SALPINGOOPHORECTOMY      Social History   Socioeconomic History   Marital status: Married    Spouse name: Not on file   Number of children: Not on file   Years of education: Not on file   Highest education level: Not on file  Occupational History   Not on file  Tobacco Use   Smoking status: Every Day    Current packs/day: 0.50    Average packs/day: 0.5 packs/day for 20.0 years (10.0 ttl pk-yrs)    Types: Cigarettes   Smokeless tobacco: Never  Vaping Use   Vaping status: Former   Substances: Nicotine  Substance and Sexual Activity   Alcohol use: Yes    Comment: occassionally   Drug use: No   Sexual activity: Yes    Birth control/protection: Surgical  Other Topics Concern   Not on file  Social History Narrative   Not on file   Social Drivers of Health   Financial Resource Strain: Low Risk  (09/04/2022)   Received from [facility]   Overall Financial  Resource Strain (CARDIA)    Difficulty of Paying Living Expenses: Not hard at all  Food Insecurity: No Food Insecurity (09/04/2022)   Received from [facility]   Hunger Vital Sign    Within the past 12 months, you worried that your food would run out before you got the money to buy more.: Never true    Within the past 12 months, the food you bought just didn't last and you didn't have money to get more.: Never true  Transportation Needs: No Transportation Needs (09/04/2022)   Received from [facility] - Transportation    In the past 12 months, has lack of transportation kept you from medical appointments or from getting medications?: No    Lack of Transportation (Non-Medical): No  Physical Activity: Not on file  Stress: Not on file  Social Connections: Not on file  Intimate Partner Violence: Not on file    Family History  Problem Relation Age of Onset   Diabetes Mother    Arthritis Father    Diabetes Father        hypoglycemia   Stroke Paternal Grandmother    Heart disease Paternal Grandfather    Breast cancer Neg Hx    Allergies  Allergen Reactions   Sulfamethoxazole -Trimethoprim  Hives, Itching and Nausea Only    Other reaction(s): Abdominal Pain, Other (See Comments) fatigue fatigue    Sulfa  Antibiotics Rash and Hives   I? Current Outpatient Medications  Medication Sig Dispense Refill   celecoxib (CELEBREX) 100 MG capsule Take 1 capsule by mouth 2 (two) times daily.     etanercept (ENBREL) 50 MG/ML injection Inject 50 mg into the skin.     pantoprazole  (PROTONIX ) 40 MG tablet Take 1 tablet (40 mg total) by mouth daily. 90 tablet 3   No current facility-administered medications for this visit.     Abtx:  Anti-infectives (From admission, onward)    None       REVIEW OF SYSTEMS:  Const: negative fever, negative chills, negative weight loss Eyes: negative diplopia or visual changes, negative eye pain ENT: negative coryza, negative sore throat Resp: negative cough, hemoptysis, dyspnea Cards: negative for chest pain, palpitations, lower extremity edema GU: negative for frequency, dysuria and hematuria GI: Negative for abdominal pain, diarrhea, bleeding, constipation Skin: no new lesions Heme: negative for easy bruising and gum/nose bleeding MS: negative for myalgias, arthralgias, back pain and muscle weakness Neurolo:negative for headaches, dizziness, vertigo, memory problems  Psych: negative for feelings of anxiety, depression   Endocrine: negative for thyroid , diabetes Allergy/Immunology- negative for any medication or food allergies ? Pertinent Positives include : Objective:  VITALS:  Ht 5' 4 (1.626 m)   Wt 108 lb (49 kg)   LMP  (LMP Unknown)   BMI 18.54 kg/m   PHYSICAL EXAM:  General: Alert, cooperative, no distress, appears stated age.  Head: Normocephalic, without obvious abnormality, atraumatic. Eyes: Conjunctivae clear, anicteric sclerae. Pupils are equal ENT Nares normal. No drainage or sinus tenderness. Lips, mucosa, and tongue normal. No Thrush Neck: Supple, symmetrical, no adenopathy, thyroid : non tender no carotid bruit and no JVD. Back: No CVA tenderness. Lungs: Clear to auscultation bilaterally. No Wheezing or Rhonchi. No rales. Heart: Regular rate and rhythm, no murmur, rub or gallop. Abdomen: Soft, non-tender,not distended. Bowel sounds normal. No masses Extremities: atraumatic, no cyanosis. No edema. No clubbing Skin: No new lesions Scar left shin and left lower abdomen/groin area Lymph: Cervical, supraclavicular normal. Neurologic: Grossly non-focal Pertinent Labs  Lab Results CBC    Component Value Date/Time   WBC 7.7 12/13/2023 0958   RBC 3.96 12/13/2023 0958   HGB 13.2 12/13/2023 0958   HGB 12.4 04/01/2020 1218   HCT 38.4 12/13/2023 0958   HCT 37.3 04/01/2020 1218   PLT 393 12/13/2023 0958   PLT 429 04/01/2020 1218   MCV 97.0 12/13/2023 0958   MCV 100 (H) 04/01/2020 1218   MCV 102 (H) 10/25/2013 1030   MCH 33.3 12/13/2023 0958   MCHC 34.4 12/13/2023 0958   RDW 12.5 12/13/2023 0958   RDW 11.4 (L) 04/01/2020 1218   RDW 12.7 10/25/2013 1030   LYMPHSABS 1.3 12/13/2023 0958   LYMPHSABS 2.1 12/20/2017 1116   LYMPHSABS 2.1 12/11/2012 1735   MONOABS 0.6 12/13/2023 0958   MONOABS 0.6 12/11/2012 1735   EOSABS 0.1 12/13/2023 0958   EOSABS 0.2 12/20/2017 1116   EOSABS 0.1 12/11/2012 1735   BASOSABS 0.1 12/13/2023 0958   BASOSABS 0.0 12/20/2017 1116   BASOSABS 0.1  12/11/2012 1735       Latest Ref Rng & Units 12/13/2023    9:58 AM 06/25/2022    9:57 AM 04/11/2020    3:29 PM  CMP  Glucose 70 - 99 mg/dL 62  90  91   BUN 6 - 20 mg/dL 11  6  8    Creatinine 0.44 - 1.00 mg/dL 9.18  9.34  9.33   Sodium 135 - 145 mmol/L 138  138  138   Potassium 3.5 - 5.1 mmol/L 3.9  3.9  3.8   Chloride 98 - 111 mmol/L 101  105  103   CO2 22 - 32 mmol/L 24  27  25    Calcium 8.9 - 10.3 mg/dL 9.3  8.7  9.2   Total Protein 6.5 - 8.1 g/dL 7.6   6.8   Total Bilirubin 0.0 - 1.2 mg/dL 0.8   0.8   Alkaline Phos 38 - 126 U/L 53   44   AST 15 - 41 U/L 22   15   ALT 0 - 44 U/L 16   12       Microbiology:   Impression/Recommendation 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 Cultures confirmed MSSA, not MRSA. Likely self-inoculation and colonization in nares or groin. Enbrel use may contribute to persistence. Initiate a decolonization protocol with mupirocin ointment in nares twice daily for 10 days and chlorhexidine  wash below the neck for 10 days.wash  bed sheets, towels, and clothes in very hot cycle. Advise on hygiene: keep nails short, avoid scratching, cover draining wounds, and use a new razor for shaving. Can take Keflex  for future infections if needed. Avoid clindamycin due to C. diff risk. Educated on mupirocin use for new lesions and covering with a Band-Aid.  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 Monitor for severe or systemic infections and consider pausing Enbrel if necessary.  Seronegative Rheumatoid arthritis and  ankylosing spondylitis    managed with Enbrel. Pain and mobility issues indicate active disease.  Weighing the pros and cons of enbrel -Continue Enbrel for disease control   Tobacco  use   Tobacco use may delay healing and exacerbate skin conditions. Advise smoking cessation to improve healing and reduce infection risk.? ?  Follow PRN  ________________________________________________ Discussed with patient and her partner

## 2024-02-08 NOTE — Patient Instructions (Addendum)
 During your visit, we discussed your recurrent skin infections, which are likely due to methicillin-susceptible Staphylococcus aureus (MSSA). We also reviewed your current medications, including Enbrel for rheumatoid arthritis and ankylosing spondylitis, and discussed the impact of immunosuppression on your condition. Additionally, we talked about the importance of smoking cessation to improve your overall health and reduce infection risks.  YOUR PLAN:  -RECURRENT SKIN AND SOFT TISSUE INFECTIONS: You have been experiencing recurrent skin infections caused by a type of bacteria called methicillin-susceptible Staphylococcus aureus (MSSA). To address this, we will start a decolonization protocol using mupirocin ointment in your nostrils twice daily for 10 days and a chlorhexidine  wash for your body below the neck for 10 days. You should also boil your bed sheets, towels, and clothes in hot water. Keep your nails short, avoid scratching, cover any draining wounds, and use a new razor for shaving. If you get another infection, we will prescribe Keflex . Avoid using clindamycin due to the risk of another infection called C. diff. Use mupirocin on any new lesions and cover them with a Band-Aid.  -IMMUNOSUPPRESSION DUE TO ENBREL: Your medication Enbrel, which you take for rheumatoid arthritis and ankylosing spondylitis, can weaken your immune system and make you more prone to infections. We discussed the risks of continuing Enbrel while you have skin infections. Monitor yourself for any serious infections and contact us  if you notice any severe symptoms.  -RHEUMATOID ARTHRITIS AND ANKYLOSING SPONDYLITIS: Rheumatoid arthritis and ankylosing spondylitis are conditions that cause pain and stiffness in your joints. You are currently managing these conditions with Enbrel.   -TOBACCO USE: Smoking can delay healing and worsen skin conditions. It is important to stop smoking to help your skin heal better and reduce the  risk of future infections. We recommend that you quit smoking for your overall health.  INSTRUCTIONS:  Please follow the decolonization protocol as discussed: apply mupirocin ointment in your nostrils twice daily for 10 days and use a chlorhexidine  wash for your body below the neck for 10 days. And other instructions are given below  Prepare Chose a period when you will be uninterrupted by going away or other distractions. To ensure that your skin is in good condition, follow the Routine Skin Care principles below to reduce drying and enhance healing. Do not start while you have any active boils. Routine Skin Care principles to reduce drying and enhance healing: Avoid the use of soap when bathing or showering when performing this decolonization. DO NOT routinely use antiseptic solutions. If you need to use something, chose a soap substitute (examples -QV Wash or Cetaphil).  When drying with a towel, be gentle and pat dry your skin. Avoid rubbing the skin.  Reduce the overall frequency of bathing or showering. A short shower (3 minutes) is better than a bath in terms of its effect on the skin.  Use a simple sorbelene based-cream on your skin prior to showering and immediately after drying. (Examples: Hydroderm or other Sorbelene-based preparations). Especially protect healing or dry areas of skin in this way. Don't use a barrier cream with a vaseline base or with perfumes and additives. You are more likely to be allergic to these products, and cause further damage to your skin.  Make sure that you clean and cover any skin cuts or grazes that occur. Try to avoid picking or biting fingernails and the skin around the nails Keep your fingernails clipped short and clean to reduce problems caused by scratching.  For itchy skin, try gently massaging sorbelene-based  cream into itchy areas instead of scratching it. A long-acting non-sedating antihistamine drug is the next option to try. However make sure that  you are not taking medication that will interact with this drug type.                                                                         To prepare yourself for your treatment, it is recommended that you complete the following steps: Remove nose, ear and other body piercing items for several days prior to the treatment and keep them out during the treatment period  Purchase a new toothbrush, disposable razor (if used), sterident for dentures (if required) and a container of alcohol hand hygiene solution (gel or rub)  Discard old toothbrushes and razors when the treatment starts. Also discard opened deodorant rollers, skin adhesive tapes, skin creams and solutions- all of these may already be contaminated with staph  Discard pumice stone(s), sponges and disposable face cloths if used   Discard all make-up brushes, creams, and implements  Discard or hot wash all fluffy toys  Wash hair brush and comb, nail files, plastic toys and cutters in the dishwasher or purchase new ones  Remove nail varnish and artificial nails  Body washes The effectiveness of the program increases if the correct procedure is used: Apply the provided antiseptic body wash (2% or 4%chlorhexidine ) in the shower daily  Take care to wash hair, under the arms and into the groin and into any folds of skin  Allow the antiseptic to remain on the skin for at least 5 minutes  Nasal ointment Disinfect your hands with alcohol gel/rub and allow to dry   Open the mupirocin 2% (Bactroban) nasal ointment.  Place small amount (size of match head) of ointment onto a clean cotton swab and massage gently around the inside of the nostril on one side, making sure not to insert it too deeply (no more than 2 cm or a little less than an inch).  Use a new cotton bud for the other nostril so that you do not contaminate the Bactroban tube with staph.   After applying the ointment, press a finger against the nose next to the nostril opening and use a  circular motion to spread the ointment within the nose  Apply the mupirocin ointment two times a day for 5 days  Disinfect your hands with alcohol rub/gel after applying the ointmentp>  Personal items (combs, razor, eyeglasses, jewelry, etc.)     Disinfect all personal items daily with an alcohol-based cleanser  House Environment and Clothes/Linens On day 2 and 5 of the treatment, clean your house, (especially the bedroom and bathroom). Clean dust off all surfaces and then vacuum clean all floor surfaces AND soft furnishings (such as your favorite chair). If your chair/couch has a vinyl or leather covering then wipe over the chair with warm soapy water and then dry with a clean towel (which should then be washed). Staph lives in skin scales from humans that contaminate the environment. This can lead to re-infection.   Disinfect the shower floor and/or bath tub daily   On days 1, 3, AND 5 of the treatment wash your clothes, underwear, pajamas and bed linen (such as towels,  sheets, washcloths, and bath mats). A hot wash with laundry detergent is best (there is no need to use expensive laundry detergent or powder). Dry clothes in sun if possible. Change into clean clothes or pajamas on those days after your shower.   Do not share or exchange any personal items of clothing  Sports/Gym     Surfaces, equipment and towels, and skin-to-skin contact are all potential sources for staph re-infection

## 2024-02-14 ENCOUNTER — Telehealth: Payer: Self-pay

## 2024-02-14 NOTE — Telephone Encounter (Signed)
 Received voicemail in triage. The office is now closed, but will send patient a mychart message to request additional  information regarding new lesions.   Enis Kleine, LPN

## 2024-02-18 NOTE — Telephone Encounter (Signed)
 Thank you :)

## 2024-02-22 ENCOUNTER — Other Ambulatory Visit: Payer: Self-pay

## 2024-02-22 ENCOUNTER — Emergency Department

## 2024-02-22 ENCOUNTER — Emergency Department
Admission: EM | Admit: 2024-02-22 | Discharge: 2024-02-23 | Disposition: A | Discharge status: Home / Self Care | Attending: Emergency Medicine | Admitting: Emergency Medicine

## 2024-02-22 DIAGNOSIS — M79605 Pain in left leg: Secondary | ICD-10-CM | POA: Diagnosis not present

## 2024-02-22 DIAGNOSIS — M79604 Pain in right leg: Secondary | ICD-10-CM | POA: Insufficient documentation

## 2024-02-22 DIAGNOSIS — F439 Reaction to severe stress, unspecified: Secondary | ICD-10-CM | POA: Diagnosis not present

## 2024-02-22 DIAGNOSIS — R002 Palpitations: Secondary | ICD-10-CM | POA: Diagnosis present

## 2024-02-22 DIAGNOSIS — F419 Anxiety disorder, unspecified: Secondary | ICD-10-CM | POA: Diagnosis not present

## 2024-02-22 LAB — CBC
HCT: 36.7 % (ref 36.0–46.0)
Hemoglobin: 12.4 g/dL (ref 12.0–15.0)
MCH: 33.6 pg (ref 26.0–34.0)
MCHC: 33.8 g/dL (ref 30.0–36.0)
MCV: 99.5 fL (ref 80.0–100.0)
Platelets: 358 K/uL (ref 150–400)
RBC: 3.69 MIL/uL — ABNORMAL LOW (ref 3.87–5.11)
RDW: 12.8 % (ref 11.5–15.5)
WBC: 8.1 K/uL (ref 4.0–10.5)
nRBC: 0 % (ref 0.0–0.2)

## 2024-02-22 LAB — BASIC METABOLIC PANEL WITH GFR
Anion gap: 10 (ref 5–15)
BUN: 9 mg/dL (ref 6–20)
CO2: 26 mmol/L (ref 22–32)
Calcium: 8.6 mg/dL — ABNORMAL LOW (ref 8.9–10.3)
Chloride: 101 mmol/L (ref 98–111)
Creatinine, Ser: 0.4 mg/dL — ABNORMAL LOW (ref 0.44–1.00)
GFR, Estimated: >60 mL/min (ref >60)
Glucose, Bld: 94 mg/dL (ref 70–99)
Potassium: 3.7 mmol/L (ref 3.5–5.1)
Sodium: 137 mmol/L (ref 135–145)

## 2024-02-22 LAB — TROPONIN I (HIGH SENSITIVITY): Troponin I (High Sensitivity): <2 ng/L (ref <18)

## 2024-02-22 NOTE — ED Triage Notes (Signed)
 Pt reports for the past few days she has had a headache palpitations and feeling lightheaded. Pt has HX RA. Denies recent illness.

## 2024-02-23 NOTE — ED Provider Notes (Signed)
 [facility] Provider Note    Event Date/Time   First MD Initiated Contact with Patient 02/23/24 0107     (approximate)   History   Palpitations   HPI Lisa Peters is a 43 y.o. female whose medical history is most notable for history of rheumatoid arthritis.  She presents for evaluation of provide of symptoms including mild generalized headache, palpitations, lightheadedness, aching pain in both legs, general malaise and fatigue.  She has had some increased joint pain which she thinks may be related to her rheumatoid arthritis.  She has had palpitations in the past but said that this felt little bit worse than they were before and she just wanted to make sure her heart was okay.  She says she is going through a stressful time at work and knows that she has some anxiety which could be contributing as well.  No difficulty breathing, no numbness or weakness in her extremities.     Physical Exam   Triage Vital Signs: ED Triage Vitals  Encounter Vitals Group     BP 02/22/24 2143 (!) 132/94     Girls Systolic BP Percentile --      Girls Diastolic BP Percentile --      Boys Systolic BP Percentile --      Boys Diastolic BP Percentile --      Pulse Rate 02/22/24 2143 78     Resp 02/22/24 2143 18     Temp 02/22/24 2143 98 F (36.7 C)     Temp Source 02/23/24 0058 Oral     SpO2 02/22/24 2143 100 %     Weight 02/22/24 2142 49.9 kg (110 lb)     Height 02/22/24 2142 1.626 m (5' 4)     Head Circumference --      Peak Flow --      Pain Score 02/22/24 2142 8     Pain Loc --      Pain Education --      Exclude from Growth Chart --     Most recent vital signs: Vitals:   02/23/24 0058 02/23/24 0200  BP: 116/78 (!) 123/91  Pulse: 74 74  Resp: 16 11  Temp: 98.2 F (36.8 C)   SpO2: 99% 98%    General: Awake, generally well-appearing, healthy body habitus, no acute distress.  Pleasant and conversant. CV:  Good peripheral perfusion.  Regular rate and  rhythm, normal heart sounds. Resp:  Normal effort. Speaking easily and comfortably, no accessory muscle usage nor intercostal retractions.  Lungs clear to auscultation bilaterally. Abd:  No distention.  Other:  No focal neurological deficits.   ED Results / Procedures / Treatments   Labs (all labs ordered are listed, but only abnormal results are displayed) Labs Reviewed  BASIC METABOLIC PANEL WITH GFR - Abnormal; Notable for the following components:      Result Value   Creatinine, Ser 0.40 (*)    Calcium 8.6 (*)    All other components within normal limits  CBC - Abnormal; Notable for the following components:   RBC 3.69 (*)    All other components within normal limits  TROPONIN I (HIGH SENSITIVITY)     EKG  ED ECG REPORT I, Darleene Dome, the attending physician, personally viewed and interpreted this ECG.  Date: 02/22/2024 EKG Time: 21: 46 Rate: 75 Rhythm: normal sinus rhythm QRS Axis: Right axis deviation Intervals: normal ST/T Wave abnormalities: normal Narrative Interpretation: no evidence of acute ischemia    RADIOLOGY  I independently viewed and interpreted the patient's two-view chest x-ray.  I see no evidence of pneumonia or pneumothorax or widened mediastinum.  I also read the radiologist's report, which confirmed no acute findings.   PROCEDURES:  Critical Care performed: No  Procedures    IMPRESSION / MDM / ASSESSMENT AND PLAN / ED COURSE  I reviewed the triage vital signs and the nursing notes.                              Differential diagnosis includes, but is not limited to, anxiety/stress, PACs, PVCs, other nonspecific cardiac arrhythmia, electrolyte or metabolic abnormality, less likely ACS or PE.  Patient's presentation is most consistent with acute presentation with potential threat to life or bodily function.  Labs/studies ordered: EKG, two-view chest x-ray, high-sensitivity troponin, CBC, BMP  Interventions/Medications given:   Medications - No data to display  (Note:  hospital course my include additional interventions and/or labs/studies not listed above.)   Vital signs are stable and reassuring, just a little bit of diastolic hypertension.  Chest x-ray and EKG are all reassuring.  I reviewed her medical record and see that she has had a Holter monitor in the past that just revealed some PACs.  Her electrolytes are normal and her hemoglobin is normal.  High-sensitivity troponin is normal and she has had symptoms for a couple of days, she has no chest pain, and there is no indication to repeat a troponin.  She has low risk for ACS based on HEAR score and she is PERC negative.  I provided reassurance and the patient is comfortable with the plan for discharge and outpatient follow-up.  I encouraged good sleep health and close outpatient follow-up.  She understands and agrees with the plan.         FINAL CLINICAL IMPRESSION(S) / ED DIAGNOSES   Final diagnoses:  Palpitations  Anxiety  Stress     Rx / DC Orders   ED Discharge Orders     None        Note:  This document was prepared using Dragon voice recognition software and may include unintentional dictation errors.   Gordan Huxley, MD 02/23/24 [id number]

## 2024-02-23 NOTE — ED Notes (Signed)
 ED Provider at bedside.

## 2024-02-23 NOTE — Discharge Instructions (Signed)
 Your workup in the Emergency Department today was reassuring.  We did not find any specific abnormalities.  We recommend you drink plenty of fluids, take your regular medications and/or any new ones prescribed today, and follow up with the doctor(s) listed in these documents as recommended.  Return to the Emergency Department if you develop new or worsening symptoms that concern you.

## 2024-03-20 ENCOUNTER — Ambulatory Visit

## 2024-03-20 VITALS — BP 116/82 | HR 67 | Ht 64.0 in | Wt 110.0 lb

## 2024-03-20 DIAGNOSIS — F419 Anxiety disorder, unspecified: Secondary | ICD-10-CM

## 2024-03-20 DIAGNOSIS — M069 Rheumatoid arthritis, unspecified: Secondary | ICD-10-CM | POA: Insufficient documentation

## 2024-03-20 DIAGNOSIS — M0520 Rheumatoid vasculitis with rheumatoid arthritis of unspecified site: Secondary | ICD-10-CM

## 2024-03-20 DIAGNOSIS — Z791 Long term (current) use of non-steroidal anti-inflammatories (NSAID): Secondary | ICD-10-CM

## 2024-03-20 DIAGNOSIS — M450 Ankylosing spondylitis of multiple sites in spine: Secondary | ICD-10-CM | POA: Diagnosis not present

## 2024-03-20 DIAGNOSIS — E559 Vitamin D deficiency, unspecified: Secondary | ICD-10-CM | POA: Diagnosis not present

## 2024-03-20 DIAGNOSIS — M0609 Rheumatoid arthritis without rheumatoid factor, multiple sites: Secondary | ICD-10-CM

## 2024-03-20 MED ORDER — DOXEPIN HCL 3 MG PO TABS: 1.0000 | ORAL_TABLET | Freq: Every day | ORAL | 0 refills | Status: AC

## 2024-03-20 MED ORDER — RAMELTEON 8 MG PO TABS: 8.0000 mg | ORAL_TABLET | Freq: Every day | ORAL | 0 refills | Status: AC

## 2024-03-20 NOTE — Progress Notes (Signed)
 New Patient Visit   Physician: Delvin Hedeen A Landan Fedie, MD  Patient: Lisa Peters   DOB: 07-22-1980   43 y.o. Female  MRN: [id number] Visit Date: 03/20/2024   Chief Complaint  Patient presents with   Establish Care   Subjective  Lisa Peters is a 43 y.o. female who presents today as a new patient to establish care.   HPI  Discussed the use of AI scribe software for clinical note transcription with the patient, who gave verbal consent to proceed.  History of Present Illness   Lisa Peters is a 43 year old female with rheumatoid arthritis and ankylosing spondylitis who presents for management of her rheumatologic conditions and associated symptoms.  Inflammatory arthropathy symptoms - Rheumatoid arthritis and ankylosing spondylitis with pain and swelling in the back, neck, lumbar region, and knees  Enbrel therapy ongoing for approximately one year, effective after Humira failure - Temporary discontinuation of Enbrel due to recent staphylococcal infection - Celebrex taken twice daily for pain control - Fatigue associated with rheumatologic disease - No organ involvement identified   Possible neuropathy Neuropathic pain and sensory disturbances - Burning pain in the feet and numbness, especially nocturnal -- Gabapentin trial discontinued due to adverse effects   Insomnia/Anxiety - Hydroxyzine used for sleep, causes daytime grogginess, ineffective for anxiety - Propranolol, Xanax , and trazodone previously trialed for anxiety - Sensitivity to multiple medications - Significant anxiety related to chronic illness and occupational stress - 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   Anxiety Yes   Long term current use of non-steroidal anti-inflammatories (NSAID)    Rheumatoid arteritis (HCC)    Ankylosing spondylitis of multiple sites in spine (HCC)    Vitamin D deficiency     Orders Placed This Encounter  Procedures   B12 and Folate Panel   TSH + free T4   VITAMIN D 25 Hydroxy (Vit-D Deficiency, Fractures)   CBC with Differential/Platelet   Comprehensive metabolic panel with GFR   Hemoglobin A1c   Lipid panel   Urinalysis, Routine w reflex microscopic    Assessment and Plan    Rheumatoid arthritis and ankylosing spondylitis    Enbrel is effective, while Humira is not. Gabapentin caused adverse effects, and potential eye issues are suspected. Continue Enbrel. Take pantoprazole  daily with Celebrex. Follow up with a rheumatologist if symptoms worsen or organ involvement is suspected.   Insomnia  - She is having fatigue and general sedation from hydroxyzine Trial ramelteon and doxepin for sleep disturbance on weekends.  Anxiety disorder and sleep disturbance    -  Prescribe ramelteon and doxepin for sleep disturbance, trial on weekends.  Anxiety is not linked to depression. Vitamin D may aid anxiety. Recommend vitamin D3 supplementation, 1000-2000 IU daily.   - We will address this further after sleep issues resolved  - Recheck vitamin D with labs  Ovarian cyst, left ovary   She has an ovarian cyst on the left ovary, with no  right ovary present. Proceed with the scheduled transvaginal ultrasound.  Tobacco use disorder   She has a long-standing tobacco use disorder. Previous Chantix use was unsuccessful. She is currently reducing smoking and plans cessation post job  transition. Encourage continued smoking reduction with a cessation goal post job transition. Discuss strategies to manage cravings  Labs with follow up in December.  Influenza vaccine declined   Objective  BP 116/82   Pulse 67   Ht 5' 4 (1.626 m)   Wt 110 lb (49.9 kg)   LMP  (LMP Unknown)   SpO2 90%   BMI 18.88 kg/m      Review of Systems  Constitutional:  Negative for chills, fever and weight loss.  Eyes:  Negative for blurred vision. h Respiratory:  Negative for cough and shortness of breath.   Cardiovascular:  Negative for chest pain and palpitations.  Skin:  Negative for rash.  Psychiatric/Behavioral:  Negative for depression. The patient is not nervous/anxious.      Physical Exam Physical Exam Vitals reviewed.  Constitutional:      Appearance: Normal appearance. Well-developed with normal weight.  HENT:     Head: Normocephalic and atraumatic.  Normal mucous membranes, no oral lesions Eyes:     Pupils: Pupils are equal, round, and reactive to light.  Neck:     Thyroid : No thyroid  mass or thyromegaly.  Cardiovascular:     Rate and Rhythm: Normal rate and regular rhythm. Normal heart sounds. Normal peripheral pulses Pulmonary:     Normal breath sounds with normal effort Abdominal:   Abdomen is soft, without tenderness or noted hepatosplenomegaly Musculoskeletal:        General: No swelling or edema  Lymphadenopathy:     Cervical: No cervical adenopathy.  Skin:    General: Skin is warm and dry without noticeable rash. Neurological:     General: No focal deficit present.  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    Rheumatoid arthritis (HCC)    Rheumatoid   Past Surgical History:   Procedure Laterality Date   ABDOMINAL HYSTERECTOMY  2019   COLPOSCOPY     CYSTOSCOPY N/A 04/14/2018   Procedure: CYSTOSCOPY;  Surgeon: Lake Read, MD;  Location: [facility] ORS;  Service: Gynecology;  Laterality: N/A;   FINGER SURGERY  2014   LAPAROSCOPIC HYSTERECTOMY N/A 04/14/2018   Procedure: HYSTERECTOMY TOTAL LAPAROSCOPIC BILATERAL SALPINGECTOMY;  Surgeon: Lake Read, MD;  Location: [facility] ORS;  Service: Gynecology;  Laterality: N/A;   SALPINGOOPHORECTOMY     Family Status  Relation Name Status   Mother  Deceased   Father  Alive   PGM  (Not Specified)   PGF  (Not Specified)   Neg Hx  (Not Specified)  No partnership data on file   Family History  Problem Relation Age of Onset   Diabetes Mother    Arthritis Father    Diabetes Father        hypoglycemia   Stroke Paternal Grandmother    Heart disease Paternal Grandfather    Breast cancer Neg Hx    Social History   Socioeconomic History   Marital status: Married    Spouse name: Not on file  Number of children: Not on file   Years of education: Not on file   Highest education level: Not on file  Occupational History   Not on file  Tobacco Use   Smoking status: Every Day    Current packs/day: 0.50    Average packs/day: 0.5 packs/day for 20.0 years (10.0 ttl pk-yrs)    Types: Cigarettes   Smokeless tobacco: Never  Vaping Use   Vaping status: Former   Substances: Nicotine  Substance and Sexual Activity   Alcohol use: Yes    Comment: occassionally   Drug use: No   Sexual activity: Yes    Birth control/protection: Surgical  Other Topics Concern   Not on file  Social History Narrative   Not on file   Social Drivers of Health   Financial Resource Strain: Low Risk  (09/04/2022)   Received from [facility]   Overall Financial Resource Strain (CARDIA)    Difficulty of Paying Living Expenses: Not hard at all  Food Insecurity: No Food Insecurity (09/04/2022)   Received from [facility]   Hunger Vital Sign    Within the past 12 months, you worried that your food would run out before you got the money to buy more.: Never true    Within the past 12 months, the food you bought just didn't last and you didn't have money to get more.: Never true  Transportation Needs: No Transportation Needs (09/04/2022)   Received from [facility] - Transportation    In the past 12 months, has lack of transportation kept you from medical appointments or from getting medications?: No    Lack of Transportation (Non-Medical): No  Physical Activity: Not on file  Stress: Not on file  Social Connections: Not on file   Outpatient Medications Prior to Visit  Medication Sig   celecoxib (CELEBREX) 100 MG capsule Take 1 capsule by mouth 2 (two) times daily.   etanercept (ENBREL) 50 MG/ML injection Inject 50 mg into the skin.   hydrOXYzine (ATARAX) 25 MG tablet Take 25 mg by mouth.   mupirocin ointment (BACTROBAN) 2 % Apply 1 Application topically 2 (two) times daily.   pantoprazole  (PROTONIX ) 40 MG tablet Take 1 tablet (40 mg total) by mouth daily.   chlorhexidine  (HIBICLENS ) 4 % external liquid Apply topically daily. Wash everyday with hibiclens  below neck (Patient not taking: Reported on 03/20/2024)   No facility-administered medications prior to visit.   Allergies  Allergen Reactions   Sulfamethoxazole -Trimethoprim  Hives, Itching and Nausea Only    Other reaction(s): Abdominal Pain, Other (See Comments) fatigue fatigue    Sulfa  Antibiotics Rash and Hives    Immunization History  Administered Date(s) Administered   Hep A / Hep B 12/03/2009, 06/15/2014   Influenza Inj Mdck Quad Pf 02/14/2018, 02/13/2021   Influenza, Seasonal, Injecte, Preservative Fre 03/20/1999   Influenza,inj,Quad PF,6+ Mos 02/07/2016, 02/01/2017, 02/15/2019   Influenza-Unspecified 02/09/2015, 02/07/2016, 02/01/2017, 02/15/2019, 03/12/2020   Moderna Sars-Covid-2 Vaccination  03/22/2020   PPD Test 09/10/2015, 09/10/2015, 11/22/2015   Td 01/09/1999, 12/03/2009   Tdap 06/15/2014, 03/22/2015   Varicella 06/21/2014, 12/07/2014    Health Maintenance  Topic Date Due   HIV Screening  Never done   Hepatitis C Screening  Never done   Pneumococcal Vaccine (1 of 2 - PCV) Never done   HPV VACCINES (1 - 3-dose SCDM series) Never done   Hepatitis B Vaccines 19-59 Average Risk (3 of 3 - 19+ 3-dose series) 08/10/2014  COVID-19 Vaccine (2 - Moderna risk series) 04/19/2020   Mammogram  04/19/2022   Influenza Vaccine  12/10/2023   DTaP/Tdap/Td (5 - Td or Tdap) 03/21/2025   Meningococcal B Vaccine  Aged Out    Patient Care Team: Everlene Parris LABOR, MD as PCP - General (Family Medicine)  Depression Screen    12/20/2017   10:18 AM 08/17/2017    2:57 PM  PHQ 2/9 Scores  PHQ - 2 Score 4 2  PHQ- 9 Score 23  12      Data saved with a previous flowsheet row definition     Aliou Mealey A [place] Mielke, MD  [facility] [phone] (phone) [phone] (fax)  [facility]

## 2024-04-14 ENCOUNTER — Other Ambulatory Visit

## 2024-04-14 DIAGNOSIS — M0520 Rheumatoid vasculitis with rheumatoid arthritis of unspecified site: Secondary | ICD-10-CM

## 2024-04-14 DIAGNOSIS — Z791 Long term (current) use of non-steroidal anti-inflammatories (NSAID): Secondary | ICD-10-CM

## 2024-04-14 DIAGNOSIS — E559 Vitamin D deficiency, unspecified: Secondary | ICD-10-CM

## 2024-04-14 DIAGNOSIS — M450 Ankylosing spondylitis of multiple sites in spine: Secondary | ICD-10-CM

## 2024-04-14 DIAGNOSIS — F419 Anxiety disorder, unspecified: Secondary | ICD-10-CM

## 2024-04-21 ENCOUNTER — Ambulatory Visit

## 2024-04-25 ENCOUNTER — Other Ambulatory Visit

## 2024-04-28 ENCOUNTER — Ambulatory Visit
